# Patient Record
Sex: Female | Born: 2000 | Race: White | Hispanic: No | Marital: Single | Smoking: Never smoker
Health system: Southern US, Community
[De-identification: ages and names within clinical notes are randomized; demographics above are authoritative.]

## PROBLEM LIST (undated history)

## (undated) DIAGNOSIS — N946 Dysmenorrhea, unspecified: Secondary | ICD-10-CM

## (undated) DIAGNOSIS — N921 Excessive and frequent menstruation with irregular cycle: Secondary | ICD-10-CM

## (undated) HISTORY — DX: Excessive and frequent menstruation with irregular cycle: N92.1

## (undated) HISTORY — DX: Dysmenorrhea, unspecified: N94.6

---
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
## 2019-01-02 ENCOUNTER — Encounter: Payer: Self-pay | Admitting: Family Medicine

## 2019-01-02 ENCOUNTER — Telehealth: Payer: Self-pay | Admitting: *Deleted

## 2019-01-02 ENCOUNTER — Ambulatory Visit (INDEPENDENT_AMBULATORY_CARE_PROVIDER_SITE_OTHER): Payer: Medicaid Other | Admitting: Family Medicine

## 2019-01-02 ENCOUNTER — Encounter (INDEPENDENT_AMBULATORY_CARE_PROVIDER_SITE_OTHER): Payer: Self-pay

## 2019-01-02 ENCOUNTER — Other Ambulatory Visit: Payer: Self-pay

## 2019-01-02 ENCOUNTER — Other Ambulatory Visit: Payer: Medicaid Other

## 2019-01-02 DIAGNOSIS — R6883 Chills (without fever): Secondary | ICD-10-CM | POA: Diagnosis not present

## 2019-01-02 DIAGNOSIS — Z20822 Contact with and (suspected) exposure to covid-19: Secondary | ICD-10-CM

## 2019-01-02 DIAGNOSIS — R6889 Other general symptoms and signs: Secondary | ICD-10-CM | POA: Diagnosis not present

## 2019-01-02 DIAGNOSIS — R197 Diarrhea, unspecified: Secondary | ICD-10-CM

## 2019-01-02 DIAGNOSIS — R51 Headache: Secondary | ICD-10-CM

## 2019-01-02 DIAGNOSIS — R519 Headache, unspecified: Secondary | ICD-10-CM
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
## 2019-01-02 NOTE — Telephone Encounter (Signed)
-----   Message from Baruch Gouty, FNP sent at 01/02/2019 12:16 PM EDT ----- Diarrhea, headache, chills. Works with public.  Pamela Richardson MR [id number]

## 2019-01-02 NOTE — Progress Notes (Signed)
Virtual Visit via telephone Note Due to COVID-19, visit is conducted virtually and was requested by patient. This visit type was conducted due to national recommendations for restrictions regarding the COVID-19 Pandemic (e.g. social distancing) in an effort to limit this patient's exposure and mitigate transmission in our community. All issues noted in this document were discussed and addressed.  A physical exam was not performed with this format.   I connected with Pamela Richardson on 01/02/19 at 1200 by telephone and verified that I am speaking with the correct person using two identifiers. Pamela Richardson is currently located at home and family is currently with them during visit. The provider, Monia Pouch, FNP is located in their office at time of visit.  I discussed the limitations, risks, security and privacy concerns of performing an evaluation and management service by telephone and the availability of in person appointments. I also discussed with the patient that there may be a patient responsible charge related to this service. The patient expressed understanding and agreed to proceed.  Subjective:  Patient ID: Pamela Richardson, female    DOB: 01-Apr-2001, 18 y.o.   MRN: [id number]  Chief Complaint:  Diarrhea   HPI: Pamela Richardson is a 18 y.o. female presenting on 01/02/2019 for Diarrhea   Pt reports three days of diarrhea. States today she developed chills, headache, and malaise. 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 No cough, congestion, or shortness of breath.   Diarrhea  This is a new problem. The current episode started in the past 7 days. The problem occurs 2 to 4 times per day. The problem has been waxing and waning. The stool consistency is described as watery. Associated symptoms include abdominal pain, chills, headaches and myalgias. Pertinent negatives include no arthralgias, bloating, coughing, fever, increased   flatus, sweats, URI, vomiting or weight loss. She has tried nothing for the symptoms.     Relevant past medical, surgical, family, and social history reviewed and updated as indicated.  Allergies and medications reviewed and updated.   Past Medical History:  Diagnosis Date  . Dysmenorrhea 11/10/2015  . Menorrhagia with irregular cycle 11/10/2015    History reviewed. No pertinent surgical history.  Social History   Socioeconomic History  . Marital status: Single    Spouse name: Not on file  . Number of children: Not on file  . Years of education: Not on file  . Highest education level: Not on file  Occupational History  . Not on file  Social Needs  . Financial resource strain: Not on file  . Food insecurity    Worry: Not on file    Inability: Not on file  . Transportation needs    Medical: Not on file    Non-medical: Not on file  Tobacco Use  . Smoking status: Never Smoker  . Smokeless tobacco: Never Used  Substance and Sexual Activity  . Alcohol use: No  . Drug use: No  . Sexual activity: Yes    Birth control/protection: Pill  Lifestyle  . Physical activity    Days per week: Not on file    Minutes per session: Not on file  . Stress: Not on file  Relationships  . Social Herbalist on phone: Not on file    Gets together: Not on file    Attends religious service: Not on file    Active member of club or organization: Not on file  Attends meetings of clubs or organizations: Not on file    Relationship status: Not on file  . Intimate partner violence    Fear of current or ex partner: Not on file    Emotionally abused: Not on file    Physically abused: Not on file    Forced sexual activity: Not on file  Other Topics Concern  . Not on file  Social History Narrative  . Not on file    Outpatient Encounter Medications as of 01/02/2019  Medication Sig  . Norethindrone Acetate-Ethinyl Estradiol (LOESTRIN) 1.5-30 MG-MCG tablet Take 1 tablet by mouth  daily.   No facility-administered encounter medications on file as of 01/02/2019.     No Known Allergies  Review of Systems  Constitutional: Positive for appetite change, chills and fatigue. Negative for activity change, diaphoresis, fever, unexpected weight change and weight loss.  HENT: Negative for congestion.   Respiratory: Negative for cough and shortness of breath.   Cardiovascular: Negative for chest pain and palpitations.  Gastrointestinal: Positive for abdominal pain and diarrhea. Negative for abdominal distention, anal bleeding, bloating, blood in stool, constipation, flatus, nausea, rectal pain and vomiting.  Genitourinary: Negative for decreased urine volume and difficulty urinating.  Musculoskeletal: Positive for myalgias. Negative for arthralgias.  Skin: Negative for color change.  Neurological: Positive for headaches. Negative for dizziness, weakness and light-headedness.  Psychiatric/Behavioral: Negative for confusion.  All other systems reviewed and are negative.        Observations/Objective: No vital signs or physical exam, this was a telephone or virtual health encounter.  Pt alert and oriented, answers all questions appropriately, and able to speak in full sentences.    Assessment and Plan: Pamela Richardson was seen today for diarrhea.  Diagnoses and all orders for this visit:  Diarrhea in adult patient Aching headache Chills Suspected 2019 Novel Coronavirus Infection Reported symptoms concerning for COVID-19 infection. [facility], they will contact pt with testing instructions. Symptomatic care discussed. BRAT diet. Adequate hydration. Tylenol as needed for fever, headache, and myalgias. Report any new or worsening symptoms. Self quarantine guidelines discussed. Infection prevention measures discussed.     Follow Up Instructions: Return if symptoms worsen or fail to improve.    I discussed the assessment and treatment plan with the  patient. The patient was provided an opportunity to ask questions and all were answered. The patient agreed with the plan and demonstrated an understanding of the instructions.   The patient was advised to call back or seek an in-person evaluation if the symptoms worsen or if the condition fails to improve as anticipated.  The above assessment and management plan was discussed with the patient. The patient verbalized understanding of and has agreed to the management plan. Patient is aware to call the clinic if symptoms persist or worsen. Patient is aware when to return to the clinic for a follow-up visit. Patient educated on when it is appropriate to go to the emergency department.    I provided 15 minutes of non-face-to-face time during this encounter. The call started at 1200. The call ended at 1215. The other time was used for coordination of care.    Kari BaarsMichelle Rakes, FNP-C [facility] [address] [place], [place] [address] [phone]

## 2019-01-03 ENCOUNTER — Encounter (INDEPENDENT_AMBULATORY_CARE_PROVIDER_SITE_OTHER): Payer: Self-pay

## 2019-01-04 ENCOUNTER — Telehealth: Payer: Self-pay | Admitting: Nurse Practitioner

## 2019-01-04 ENCOUNTER — Telehealth: Payer: Self-pay | Admitting: *Deleted

## 2019-01-04 ENCOUNTER — Encounter (INDEPENDENT_AMBULATORY_CARE_PROVIDER_SITE_OTHER): Payer: Self-pay

## 2019-01-04 NOTE — Telephone Encounter (Signed)
Pt notified no results available If worsening SOB, pt needs to go to ER Pt verbalizes understanding

## 2019-01-04 NOTE — Telephone Encounter (Signed)
Needs to go to urgent care for chest xray

## 2019-01-04 NOTE — Telephone Encounter (Signed)
If she wold like to be tested for covid let us know

## 2019-01-04 NOTE — Telephone Encounter (Signed)
Contacted pt due to Ursa response of shortness of breath, the pt says that it is intermittent, and occurs with and without activity; she also says that she does not feel like she is in danger; pt advised to contact her PCP for further recommendations; she verbalized understanding.

## 2019-01-04 NOTE — Telephone Encounter (Signed)
Patient was tested for COVID 7/14 and states she started having SOB today.  Was advised to contact us since she had new symptoms. Please advise

## 2019-01-04 NOTE — Telephone Encounter (Signed)
Patient aware.

## 2019-01-05 ENCOUNTER — Encounter (INDEPENDENT_AMBULATORY_CARE_PROVIDER_SITE_OTHER): Payer: Self-pay

## 2019-01-05 ENCOUNTER — Telehealth: Payer: Self-pay

## 2019-01-05 NOTE — Telephone Encounter (Signed)
Patient called due to Dobson response of vomiting and worse appetite than yesterday. She says this morning about 0500, she woke up drenched in sweat, went to the bathroom to vomit, but only dry heaves. She says she's nauseated. I advised to stay hydrated with liquids, ice chips, popsicles, anything that she can sip on and to notify her PCP about the nausea, she verbalized understanding.

## 2019-01-06 ENCOUNTER — Other Ambulatory Visit: Payer: Self-pay

## 2019-01-06 ENCOUNTER — Telehealth: Payer: Self-pay

## 2019-01-06 ENCOUNTER — Emergency Department (HOSPITAL_COMMUNITY)
Admission: EM | Admit: 2019-01-06 | Discharge: 2019-01-06 | Disposition: A | Discharge status: Home / Self Care | Payer: Medicaid Other | Attending: Emergency Medicine | Admitting: Emergency Medicine

## 2019-01-06 ENCOUNTER — Encounter (INDEPENDENT_AMBULATORY_CARE_PROVIDER_SITE_OTHER): Payer: Self-pay

## 2019-01-06 ENCOUNTER — Encounter (HOSPITAL_COMMUNITY): Payer: Self-pay

## 2019-01-06 DIAGNOSIS — Z20828 Contact with and (suspected) exposure to other viral communicable diseases: Secondary | ICD-10-CM | POA: Insufficient documentation

## 2019-01-06 DIAGNOSIS — Z03818 Encounter for observation for suspected exposure to other biological agents ruled out: Secondary | ICD-10-CM | POA: Diagnosis not present

## 2019-01-06 DIAGNOSIS — Z79899 Other long term (current) drug therapy: Secondary | ICD-10-CM | POA: Diagnosis not present

## 2019-01-06 DIAGNOSIS — R112 Nausea with vomiting, unspecified: Secondary | ICD-10-CM | POA: Diagnosis not present

## 2019-01-06 DIAGNOSIS — R109 Unspecified abdominal pain: Secondary | ICD-10-CM | POA: Insufficient documentation

## 2019-01-06 DIAGNOSIS — R1013 Epigastric pain: Secondary | ICD-10-CM | POA: Diagnosis not present

## 2019-01-06 DIAGNOSIS — R197 Diarrhea, unspecified: Secondary | ICD-10-CM | POA: Diagnosis not present

## 2019-01-06 LAB — COMPREHENSIVE METABOLIC PANEL
ALT: 13 U/L (ref 0–44)
AST: 18 U/L (ref 15–41)
Albumin: 4 g/dL (ref 3.5–5.0)
Alkaline Phosphatase: 73 U/L (ref 38–126)
Anion gap: 11 (ref 5–15)
BUN: 8 mg/dL (ref 6–20)
CO2: 21 mmol/L — ABNORMAL LOW (ref 22–32)
Calcium: 9.4 mg/dL (ref 8.9–10.3)
Chloride: 104 mmol/L (ref 98–111)
Creatinine, Ser: 0.72 mg/dL (ref 0.44–1.00)
GFR calc Af Amer: >60 mL/min (ref >60)
GFR calc non Af Amer: >60 mL/min (ref >60)
Glucose, Bld: 83 mg/dL (ref 70–99)
Potassium: 3.9 mmol/L (ref 3.5–5.1)
Sodium: 136 mmol/L (ref 135–145)
Total Bilirubin: 0.1 mg/dL — ABNORMAL LOW (ref 0.3–1.2)
Total Protein: 7.5 g/dL (ref 6.5–8.1)

## 2019-01-06 LAB — CBC WITH DIFFERENTIAL/PLATELET
Abs Immature Granulocytes: 0.02 10*3/uL (ref 0.00–0.07)
Basophils Absolute: 0.1 10*3/uL (ref 0.0–0.1)
Basophils Relative: 1 %
Eosinophils Absolute: 0.1 10*3/uL (ref 0.0–0.5)
Eosinophils Relative: 1 %
HCT: 40 % (ref 36.0–46.0)
Hemoglobin: 13.3 g/dL (ref 12.0–15.0)
Immature Granulocytes: 0 %
Lymphocytes Relative: 25 %
Lymphs Abs: 2.2 10*3/uL (ref 0.7–4.0)
MCH: 28.7 pg (ref 26.0–34.0)
MCHC: 33.3 g/dL (ref 30.0–36.0)
MCV: 86.4 fL (ref 80.0–100.0)
Monocytes Absolute: 0.5 10*3/uL (ref 0.1–1.0)
Monocytes Relative: 6 %
Neutro Abs: 5.7 10*3/uL (ref 1.7–7.7)
Neutrophils Relative %: 67 %
Platelets: 293 10*3/uL (ref 150–400)
RBC: 4.63 MIL/uL (ref 3.87–5.11)
RDW: 12.7 % (ref 11.5–15.5)
WBC: 8.5 10*3/uL (ref 4.0–10.5)
nRBC: 0 % (ref 0.0–0.2)

## 2019-01-06 LAB — PREGNANCY, URINE: Preg Test, Ur: NEGATIVE

## 2019-01-06 LAB — URINALYSIS, ROUTINE W REFLEX MICROSCOPIC
Bilirubin Urine: NEGATIVE
Glucose, UA: NEGATIVE mg/dL
Hgb urine dipstick: NEGATIVE
Ketones, ur: 5 mg/dL — AB
Leukocytes,Ua: NEGATIVE
Nitrite: NEGATIVE
Protein, ur: NEGATIVE mg/dL
Specific Gravity, Urine: 1.011 (ref 1.005–1.030)
pH: 6 (ref 5.0–8.0)

## 2019-01-06 LAB — LIPASE, BLOOD: Lipase: 26 U/L (ref 11–51)

## 2019-01-06 MED ORDER — ALUM & MAG HYDROXIDE-SIMETH 200-200-20 MG/5ML PO SUSP
30.0000 mL | Freq: Once | ORAL | Status: AC
  Administered 2019-01-06: 18:00:00 30 mL via ORAL
  Filled 2019-01-06: qty 30

## 2019-01-06 MED ORDER — LIDOCAINE VISCOUS HCL 2 % MT SOLN
15.0000 mL | Freq: Once | OROMUCOSAL | Status: AC
  Administered 2019-01-06: 15 mL via ORAL
  Filled 2019-01-06: qty 15

## 2019-01-06 MED ORDER — ONDANSETRON 4 MG PO TBDP: 4.0000 mg | ORAL_TABLET | Freq: Three times a day (TID) | ORAL | 0 refills | Status: DC | PRN

## 2019-01-06 MED ORDER — ONDANSETRON HCL 4 MG/2ML IJ SOLN
4.0000 mg | Freq: Once | INTRAMUSCULAR | Status: AC
  Administered 2019-01-06: 19:00:00 4 mg via INTRAVENOUS
  Filled 2019-01-06: qty 2

## 2019-01-06 MED ORDER — SODIUM CHLORIDE 0.9 % IV BOLUS
1000.0000 mL | Freq: Once | INTRAVENOUS | Status: AC
  Administered 2019-01-06: 1000 mL via INTRAVENOUS

## 2019-01-06 NOTE — ED Notes (Signed)
ED Provider at bedside. 

## 2019-01-06 NOTE — ED Notes (Signed)
Patient verbalizes understanding of discharge instructions. Opportunity for questioning and answers were provided. Armband removed by staff, pt discharged from ED.  

## 2019-01-06 NOTE — Discharge Instructions (Addendum)
You have been seen today for abdominal pain. Please read and follow all provided instructions. Return to the emergency room for worsening condition or new concerning symptoms.    Your COVID test is pending.  You should continue to self quarantine until you get the results.  1. Medications:  Prescription sent to your pharmacy for Zofran.  This is for nausea.  Please take as directed. Continue usual home medications Take medications as prescribed. Please review all of the medicines and only take them if you do not have an allergy to them.   2. Treatment: rest, drink plenty of fluids  3. Follow Up: Please follow up with your primary doctor in 2-5 days for discussion of your diagnoses and further evaluation after today's visit; Call today to arrange your follow up.    It is also a possibility that you have an allergic reaction to any of the medicines that you have been prescribed - Everybody reacts differently to medications and while MOST people have no trouble with most medicines, you may have a reaction such as nausea, vomiting, rash, swelling, shortness of breath. If this is the case, please stop taking the medicine immediately and contact your physician.  ?

## 2019-01-06 NOTE — ED Provider Notes (Signed)
Care assumed from previous provider PA [place]. Please see note for further details. Case discussed, plan agreed upon. Will follow up on UA, Upreg as well as PO challenge.   UA unremarkable Upreg negative  Patient tolerating PO.  Discharged home with previous provider recommendations and rx.    Pamela Richardson, Ozella Almond, PA-C 01/06/19 2031    Elnora Morrison, MD 01/07/19 [phone]

## 2019-01-06 NOTE — ED Provider Notes (Addendum)
[facility] Provider Note   CSN: [id number] Arrival date & time: 01/06/19  1618    History   Chief Complaint Chief Complaint  Patient presents with  . COVID sxs    HPI Pamela Richardson is a 18 y.o. female with past medical history of dysmenorrhea sent to emergency department today with abdominal cramping x 1 week. She states she has constant cramping sensation that feels different than her usual cramps with menses cycle. She states the cramping is painful rating it 6 out of 10 in severity.  She reports associated nause and vomiting.  She reports 2 episodes of nonbloody nonbilious emesis in the last 24 hours.  She has had decreased p.o. intake since symptom onset.  She is also reporting generalized body aches.  She tested for COVID earlier this week but has not yet gotten the results.  She has not taken any medications for her symptoms prior to arrival.  She denies fever, chills, chest pain, shortness of breath, cough, urinary symptoms, diarrhea.  She denies any sick contacts.   Past Medical History:  Diagnosis Date  . Dysmenorrhea 11/10/2015  . Menorrhagia with irregular cycle 11/10/2015    Patient Active Problem List   Diagnosis Date Noted  . Chronic idiopathic constipation 04/21/2016  . Dysmenorrhea 11/10/2015    History reviewed. No pertinent surgical history.   OB History    Gravida  0   Para  0   Term  0   Preterm  0   AB  0   Living  0     SAB  0   TAB  0   Ectopic  0   Multiple  0   Live Births               Home Medications    Prior to Admission medications   Medication Sig Start Date End Date Taking? Authorizing Provider  Norethindrone Acetate-Ethinyl Estradiol (LOESTRIN) 1.5-30 MG-MCG tablet Take 1 tablet by mouth daily. 11/01/18   Adline PotterGriffin, Jennifer A, NP  ondansetron (ZOFRAN ODT) 4 MG disintegrating tablet Take 1 tablet (4 mg total) by mouth every 8 (eight) hours as needed for nausea or vomiting. 01/06/19    Mayley Lish, Caroleen HammanKaitlyn E, PA-C    Family History Family History  Problem Relation Age of Onset  . Scoliosis Mother   . Other Maternal Grandmother        suicide  . Hypertension Maternal Grandfather   . Diabetes Maternal Grandfather   . Cancer Maternal Grandfather   . Scoliosis Other     Social History Social History   Tobacco Use  . Smoking status: Never Smoker  . Smokeless tobacco: Never Used  Substance Use Topics  . Alcohol use: No  . Drug use: No     Allergies   Patient has no known allergies.   Review of Systems Review of Systems  Constitutional: Positive for appetite change. Negative for chills and fever.  HENT: Negative for congestion, ear discharge, ear pain, sinus pressure, sinus pain and sore throat.   Eyes: Negative for pain and redness.  Respiratory: Negative for cough and shortness of breath.   Cardiovascular: Negative for chest pain.  Gastrointestinal: Positive for abdominal pain, nausea and vomiting. Negative for constipation and diarrhea.  Genitourinary: Negative for dysuria and hematuria.  Musculoskeletal: Negative for back pain and neck pain.  Skin: Negative for wound.  Neurological: Negative for weakness, numbness and headaches.     Physical Exam Updated Vital Signs BP 137/87 (BP  Location: Right Arm)   Pulse (!) 103   Temp 99.2 F (37.3 C) (Oral)   Resp 15   Ht 5\' 5"  (1.651 m)   Wt 59 kg   SpO2 98%   BMI 21.63 kg/m   Physical Exam Vitals signs and nursing note reviewed.  Constitutional:      General: She is not in acute distress.    Appearance: She is not ill-appearing.  HENT:     Head: Normocephalic and atraumatic.     Right Ear: Tympanic membrane and external ear normal.     Left Ear: Tympanic membrane and external ear normal.     Nose: Nose normal. No congestion.     Mouth/Throat:     Mouth: Mucous membranes are dry.     Pharynx: Oropharynx is clear.  Eyes:     General: No scleral icterus.       Right eye: No discharge.         Left eye: No discharge.     Extraocular Movements: Extraocular movements intact.     Conjunctiva/sclera: Conjunctivae normal.     Pupils: Pupils are equal, round, and reactive to light.  Neck:     Musculoskeletal: Normal range of motion.     Vascular: No JVD.  Cardiovascular:     Rate and Rhythm: Regular rhythm. Tachycardia present.     Pulses: Normal pulses.          Radial pulses are 2+ on the right side and 2+ on the left side.     Heart sounds: Normal heart sounds.  Pulmonary:     Comments: Lungs clear to auscultation in all fields. Symmetric chest rise. No wheezing, rales, or rhonchi. Abdominal:     General: Bowel sounds are normal.     Tenderness: There is generalized abdominal tenderness. There is no right CVA tenderness or left CVA tenderness.     Comments: Abdomen is soft, non-distended. No rigidity, no guarding. No peritoneal signs.  Musculoskeletal: Normal range of motion.  Skin:    General: Skin is warm and dry.     Capillary Refill: Capillary refill takes less than 2 seconds.  Neurological:     Mental Status: She is oriented to person, place, and time.     GCS: GCS eye subscore is 4. GCS verbal subscore is 5. GCS motor subscore is 6.     Comments: Fluent speech, no facial droop.  Psychiatric:        Behavior: Behavior normal.      ED Treatments / Results  Labs (all labs ordered are listed, but only abnormal results are displayed) Labs Reviewed  COMPREHENSIVE METABOLIC PANEL - Abnormal; Notable for the following components:      Result Value   CO2 21 (*)    Total Bilirubin 0.1 (*)    All other components within normal limits  NOVEL CORONAVIRUS, NAA (HOSPITAL ORDER, SEND-OUT TO REF LAB)  CBC WITH DIFFERENTIAL/PLATELET  LIPASE, BLOOD  URINALYSIS, ROUTINE W REFLEX MICROSCOPIC  PREGNANCY, URINE    EKG None  Radiology No results found.  Procedures Procedures (including critical care time)  Medications Ordered in ED Medications  sodium chloride 0.9 %  bolus 1,000 mL (1,000 mLs Intravenous New Bag/Given 01/06/19 1838)  ondansetron (ZOFRAN) injection 4 mg (4 mg Intravenous Given 01/06/19 1837)  alum & mag hydroxide-simeth (MAALOX/MYLANTA) 200-200-20 MG/5ML suspension 30 mL (30 mLs Oral Given 01/06/19 1754)    And  lidocaine (XYLOCAINE) 2 % viscous mouth solution 15 mL (15 mLs Oral Given  01/06/19 1754)     Initial Impression / Assessment and Plan / ED Course  I have reviewed the triage vital signs and the nursing notes.  Pertinent labs & imaging results that were available during my care of the patient were reviewed by me and considered in my medical decision making (see chart for details).   Patient is well-appearing, no acute distress.  In triage she was noted to be tachycardic at 103.  During interview and physical exam heart rate ranged from 85-94. Lungs are clear to auscultation all fields. She denies cought, do not feel chest xray is necessary at this time. She has generalized abdominal tenderness on exam, no peritoneal signs. Mucous membranes are dry.  Labs unremarkable. No leukocytosis, electrolyte abnormalities or renal insufficiency.  After GI cocktail she states abdominal pain has improved.  On repeat exam abdomen is benign.  No peritoneal signs. Tested for covid with send out test. Pt reports she had a test but it did not hurt and only went just inside her nose, unsure if will be accurate.  UA and pregnancy test pending.   Pamela Richardson was evaluated in Emergency Department on 01/06/2019 for the symptoms described in the history of present illness. She was evaluated in the context of the global COVID-19 pandemic, which necessitated consideration that the patient might be at risk for infection with the SARS-CoV-2 virus that causes COVID-19. Institutional protocols and algorithms that pertain to the evaluation of patients at risk for COVID-19 are in a state of rapid change based on information released by regulatory bodies including the CDC and  federal and state organizations. These policies and algorithms were followed during the patient's care in the ED.   Patient care transferred to J. Ward PA-C at the end of my shift. Patient presentation, ED course, and plan of care discussed with review of all pertinent labs and imaging. Please see her note for further details regarding further ED course and disposition.   Final Clinical Impressions(s) / ED Diagnoses   Final diagnoses:  Abdominal pain, unspecified abdominal location    ED Discharge Orders         Ordered    ondansetron (ZOFRAN ODT) 4 MG disintegrating tablet  Every 8 hours PRN     01/06/19 1912           Cherre Robins, PA-C 01/06/19 1917    Flint Melter 01/06/19 Oris Drone, MD 01/07/19 2022

## 2019-01-06 NOTE — Telephone Encounter (Signed)
Pt decrease appetite becoming worse. Pt advised recommendation based on MyChart protocols. Pt verbalized understanding.

## 2019-01-06 NOTE — ED Triage Notes (Addendum)
Pt arrives POV from home c/o body aches, abdominal pain, nausea, unable to keep food down since last Sunday Pt was advised by [facility] RN to come to the ED to be tested for Covid-19. Pt was swabbed for COVID-19 on Tuesday afternoon at [facility] in [place] but was told she will have the result next Tuesday.

## 2019-01-07 ENCOUNTER — Encounter (INDEPENDENT_AMBULATORY_CARE_PROVIDER_SITE_OTHER): Payer: Self-pay

## 2019-01-07 LAB — NOVEL CORONAVIRUS, NAA: SARS-CoV-2, NAA: NOT DETECTED

## 2019-01-08 ENCOUNTER — Encounter (INDEPENDENT_AMBULATORY_CARE_PROVIDER_SITE_OTHER): Payer: Self-pay

## 2019-01-08 ENCOUNTER — Telehealth: Payer: Self-pay

## 2019-01-08 ENCOUNTER — Telehealth: Payer: Self-pay | Admitting: Nurse Practitioner

## 2019-01-08 NOTE — Telephone Encounter (Signed)
appt scheduled Pt notified 

## 2019-01-08 NOTE — Telephone Encounter (Signed)
Abdominal pain seen in ED--as soon as pt eats she developed abdominal pain.  Problem has lasted for over a week. Pt denies distension. No BM since Tuesday.  Pt advised to call PCP for hospital F/U.  Discussed appetite and informed:   . If appetite becomes worse:   encourage patient to drink fluids as tolerated, work their way up to bland solid food such as crackers, pretzels, soup, bread or applesauce and boiled starches.  . If patient is unable to tolerate any foods or liquids, notify PCP. Marland Kitchen  If patient develops severe vomiting (more than 6 times a day and or >8 hours) and/or severe abdominal pain advise patient to call 911 and seek treatment in ED

## 2019-01-09 ENCOUNTER — Encounter: Payer: Self-pay | Admitting: Nurse Practitioner

## 2019-01-09 ENCOUNTER — Ambulatory Visit (INDEPENDENT_AMBULATORY_CARE_PROVIDER_SITE_OTHER): Payer: Medicaid Other | Admitting: Nurse Practitioner

## 2019-01-09 DIAGNOSIS — K269 Duodenal ulcer, unspecified as acute or chronic, without hemorrhage or perforation: Secondary | ICD-10-CM

## 2019-01-09 LAB — NOVEL CORONAVIRUS, NAA (HOSP ORDER, SEND-OUT TO REF LAB; TAT 18-24 HRS): SARS-CoV-2, NAA: NOT DETECTED

## 2019-01-09 MED ORDER — OMEPRAZOLE 40 MG PO CPDR: 40.0000 mg | DELAYED_RELEASE_CAPSULE | Freq: Every day | ORAL | 3 refills | Status: DC

## 2019-01-09 NOTE — Progress Notes (Signed)
   Virtual Visit via telephone Note  I connected with Pamela Richardson on 01/09/19 at 11:00 by telephone and verified that I am speaking with the correct person using two identifiers. Pamela Richardson is currently located at home and no one is currently with her during visit. The provider, Mary-Margaret Hassell Done, FNP is located in their office at time of visit.  I discussed the limitations, risks, security and privacy concerns of performing an evaluation and management service by telephone and the availability of in person appointments. I also discussed with the patient that there may be a patient responsible charge related to this service. The patient expressed understanding and agreed to proceed.   History and Present Illness:   Chief Complaint: Hospitalization Follow-up   HPI Patient has been sick for several weeks. She started having diarrhea last weekend . She called office and they wanted her corona tested- they came back negative. She continued to have diarrhea so she went to the ED on 01/06/19. They did blood work which was all negative. They sent her home on zofran. She has not taken zofran because she has had no nausea. She has not had a bowel movement since last Tuesday. She says she is having abdominal cramping. Cramping usually occurs after she eatss and is in her upper abdomen. Pain last 1-2 hours at a time   Review of Systems  Constitutional: Negative for diaphoresis and weight loss.  Eyes: Negative for blurred vision, double vision and pain.  Respiratory: Negative for shortness of breath.   Cardiovascular: Negative for chest pain, palpitations, orthopnea and leg swelling.  Gastrointestinal: Negative for abdominal pain.  Skin: Negative for rash.  Neurological: Negative for dizziness, sensory change, loss of consciousness, weakness and headaches.  Endo/Heme/Allergies: Negative for polydipsia. Does not bruise/bleed easily.  Psychiatric/Behavioral: Negative for memory loss. The patient  does not have insomnia.   All other systems reviewed and are negative.    Observations/Objective: Alert and oriented- answers all questions appropriately Mid distress.  Assessment and Plan: Pamela Richardson in today with chief complaint of Hospitalization Follow-up   1. Duodenal ulcer Could be gallbladder If meprazole does not help will order gall bladder u/s Avoid spicy and fatty foods - omeprazole (PRILOSEC) 40 MG capsule; Take 1 capsule (40 mg total) by mouth daily.  Dispense: 30 capsule; Refill: 3   Follow Up Instructions: prn    I discussed the assessment and treatment plan with the patient. The patient was provided an opportunity to ask questions and all were answered. The patient agreed with the plan and demonstrated an understanding of the instructions.   The patient was advised to call back or seek an in-person evaluation if the symptoms worsen or if the condition fails to improve as anticipated.  The above assessment and management plan was discussed with the patient. The patient verbalized understanding of and has agreed to the management plan. Patient is aware to call the clinic if symptoms persist or worsen. Patient is aware when to return to the clinic for a follow-up visit. Patient educated on when it is appropriate to go to the emergency department.   Time call ended:  11:15  I provided 15 minutes of non-face-to-face time during this encounter.    Mary-Margaret Hassell Done, FNP

## 2019-02-01 ENCOUNTER — Ambulatory Visit: Payer: Medicaid Other | Admitting: Adult Health

## 2019-02-08 ENCOUNTER — Other Ambulatory Visit: Payer: Self-pay

## 2019-02-08 ENCOUNTER — Ambulatory Visit (INDEPENDENT_AMBULATORY_CARE_PROVIDER_SITE_OTHER): Payer: Medicaid Other | Admitting: Women's Health

## 2019-02-08 ENCOUNTER — Encounter: Payer: Self-pay | Admitting: Women's Health

## 2019-02-08 ENCOUNTER — Ambulatory Visit (INDEPENDENT_AMBULATORY_CARE_PROVIDER_SITE_OTHER): Payer: Medicaid Other | Admitting: *Deleted

## 2019-02-08 VITALS — BP 121/82 | HR 92 | Ht 65.0 in | Wt 120.0 lb

## 2019-02-08 DIAGNOSIS — Z3202 Encounter for pregnancy test, result negative: Secondary | ICD-10-CM

## 2019-02-08 DIAGNOSIS — N920 Excessive and frequent menstruation with regular cycle: Secondary | ICD-10-CM

## 2019-02-08 DIAGNOSIS — Z30013 Encounter for initial prescription of injectable contraceptive: Secondary | ICD-10-CM

## 2019-02-08 DIAGNOSIS — Z3042 Encounter for surveillance of injectable contraceptive: Secondary | ICD-10-CM

## 2019-02-08 DIAGNOSIS — N946 Dysmenorrhea, unspecified: Secondary | ICD-10-CM

## 2019-02-08 LAB — POCT URINE PREGNANCY: Preg Test, Ur: NEGATIVE

## 2019-02-08 MED ORDER — MEDROXYPROGESTERONE ACETATE 150 MG/ML IM SUSP
150.0000 mg | Freq: Once | INTRAMUSCULAR | Status: AC
  Administered 2019-02-08: 150 mg via INTRAMUSCULAR

## 2019-02-08 MED ORDER — MEDROXYPROGESTERONE ACETATE 150 MG/ML IM SUSP: 150.0000 mg | INTRAMUSCULAR | 3 refills | Status: DC

## 2019-02-08 NOTE — Patient Instructions (Signed)
Medroxyprogesterone injection [Contraceptive] What is this medicine? MEDROXYPROGESTERONE (me DROX ee proe JES te rone) contraceptive injections prevent pregnancy. They provide effective birth control for 3 months. Depo-subQ Provera 104 is also used for treating pain related to endometriosis. This medicine may be used for other purposes; ask your health care provider or pharmacist if you have questions. COMMON BRAND NAME(S): Depo-Provera, Depo-subQ Provera 104 What should I tell my health care provider before I take this medicine? They need to know if you have any of these conditions:  frequently drink alcohol  asthma  blood vessel disease or a history of a blood clot in the lungs or legs  bone disease such as osteoporosis  breast cancer  diabetes  eating disorder (anorexia nervosa or bulimia)  high blood pressure  HIV infection or AIDS  kidney disease  liver disease  mental depression  migraine  seizures (convulsions)  stroke  tobacco smoker  vaginal bleeding  an unusual or allergic reaction to medroxyprogesterone, other hormones, medicines, foods, dyes, or preservatives  pregnant or trying to get pregnant  breast-feeding How should I use this medicine? Depo-Provera Contraceptive injection is given into a muscle. Depo-subQ Provera 104 injection is given under the skin. These injections are given by a health care professional. You must not be pregnant before getting an injection. The injection is usually given during the first 5 days after the start of a menstrual period or 6 weeks after delivery of a baby. Talk to your pediatrician regarding the use of this medicine in children. Special care may be needed. These injections have been used in female children who have started having menstrual periods. Overdosage: If you think you have taken too much of this medicine contact a poison control center or emergency room at once. NOTE: This medicine is only for you. Do not  share this medicine with others. What if I miss a dose? Try not to miss a dose. You must get an injection once every 3 months to maintain birth control. If you cannot keep an appointment, call and reschedule it. If you wait longer than 13 weeks between Depo-Provera contraceptive injections or longer than 14 weeks between Depo-subQ Provera 104 injections, you could get pregnant. Use another method for birth control if you miss your appointment. You may also need a pregnancy test before receiving another injection. What may interact with this medicine? Do not take this medicine with any of the following medications:  bosentan This medicine may also interact with the following medications:  aminoglutethimide  antibiotics or medicines for infections, especially rifampin, rifabutin, rifapentine, and griseofulvin  aprepitant  barbiturate medicines such as phenobarbital or primidone  bexarotene  carbamazepine  medicines for seizures like ethotoin, felbamate, oxcarbazepine, phenytoin, topiramate  modafinil  St. John's wort This list may not describe all possible interactions. Give your health care provider a list of all the medicines, herbs, non-prescription drugs, or dietary supplements you use. Also tell them if you smoke, drink alcohol, or use illegal drugs. Some items may interact with your medicine. What should I watch for while using this medicine? This drug does not protect you against HIV infection (AIDS) or other sexually transmitted diseases. Use of this product may cause you to lose calcium from your bones. Loss of calcium may cause weak bones (osteoporosis). Only use this product for more than 2 years if other forms of birth control are not right for you. The longer you use this product for birth control the more likely you will be at risk   for weak bones. Ask your health care professional how you can keep strong bones. You may have a change in bleeding pattern or irregular periods.  Many females stop having periods while taking this drug. If you have received your injections on time, your chance of being pregnant is very low. If you think you may be pregnant, see your health care professional as soon as possible. Tell your health care professional if you want to get pregnant within the next year. The effect of this medicine may last a long time after you get your last injection. What side effects may I notice from receiving this medicine? Side effects that you should report to your doctor or health care professional as soon as possible:  allergic reactions like skin rash, itching or hives, swelling of the face, lips, or tongue  breast tenderness or discharge  breathing problems  changes in vision  depression  feeling faint or lightheaded, falls  fever  pain in the abdomen, chest, groin, or leg  problems with balance, talking, walking  unusually weak or tired  yellowing of the eyes or skin Side effects that usually do not require medical attention (report to your doctor or health care professional if they continue or are bothersome):  acne  fluid retention and swelling  headache  irregular periods, spotting, or absent periods  temporary pain, itching, or skin reaction at site where injected  weight gain This list may not describe all possible side effects. Call your doctor for medical advice about side effects. You may report side effects to FDA at 1-800-FDA-1088. Where should I keep my medicine? This does not apply. The injection will be given to you by a health care professional. NOTE: This sheet is a summary. It may not cover all possible information. If you have questions about this medicine, talk to your doctor, pharmacist, or health care provider.  2020 Elsevier/Gold Standard (2008-06-28 18:37:56)  

## 2019-02-08 NOTE — Progress Notes (Signed)
   GYN VISIT Patient name: Pamela Richardson MRN [id number]  Date of birth: 10-Nov-2000 Chief Complaint:   Follow-up (Birth Control)  History of Present Illness:   Pamela Richardson is a 18 y.o. G0P0000 Caucasian female being seen today for f/u on Lo Estrin. Has tried multiple different pills to try to regulate and lighten periods, has gotten down to 1 period/mth, but still heavy. Stopped taking COCs 2wks ago, had period, no sex since. Wants to try something else. Discussed all options. Wants depo.      Patient's last menstrual period was 01/26/2019. The current method of family planning is OCP (estrogen/progesterone).  Last pap <21yo. Results were:  n/a Review of Systems:   Pertinent items are noted in HPI Denies fever/chills, dizziness, headaches, visual disturbances, fatigue, shortness of breath, chest pain, abdominal pain, vomiting, abnormal vaginal discharge/itching/odor/irritation, problems with periods, bowel movements, urination, or intercourse unless otherwise stated above.  Pertinent History Reviewed:  Reviewed past medical,surgical, social, obstetrical and family history.  Reviewed problem list, medications and allergies. Physical Assessment:   Vitals:   02/08/19 1347  BP: 121/82  Pulse: 92  Weight: 120 lb (54.4 kg)  Height: 5\' 5"  (1.651 m)  Body mass index is 19.97 kg/m.       Physical Examination:   General appearance: alert, well appearing, and in no distress  Mental status: alert, oriented to person, place, and time  Skin: warm & dry   Cardiovascular: normal heart rate noted  Respiratory: normal respiratory effort, no distress  Abdomen: soft, non-tender   Pelvic: examination not indicated  Extremities: no edema   No results found for this or any previous visit (from the past 24 hour(s)).  Assessment & Plan:  1) Contraception management> rx depo, come back today for 1st shot, condoms x 2wks  2) Heavy periods> will try depo  Meds:  Meds ordered this encounter   Medications  . medroxyPROGESTERone (DEPO-PROVERA) 150 MG/ML injection    Sig: Inject 1 mL (150 mg total) into the muscle every 3 (three) months.    Dispense:  1 mL    Refill:  3    Order Specific Question:   Supervising Provider    Answer:   Elonda Husky, LUTHER H [[id number]]    No orders of the defined types were placed in this encounter.   Return for today for , Depo injection.  [place], [facility] ([facility]) 02/08/2019 2:33 PM

## 2019-03-22 DIAGNOSIS — F439 Reaction to severe stress, unspecified: Secondary | ICD-10-CM | POA: Diagnosis not present

## 2019-03-22 DIAGNOSIS — F331 Major depressive disorder, recurrent, moderate: Secondary | ICD-10-CM | POA: Diagnosis not present

## 2019-04-13 DIAGNOSIS — F331 Major depressive disorder, recurrent, moderate: Secondary | ICD-10-CM | POA: Diagnosis not present

## 2019-05-03 ENCOUNTER — Other Ambulatory Visit: Payer: Self-pay

## 2019-05-03 ENCOUNTER — Ambulatory Visit (INDEPENDENT_AMBULATORY_CARE_PROVIDER_SITE_OTHER): Payer: Medicaid Other | Admitting: *Deleted

## 2019-05-03 DIAGNOSIS — Z3042 Encounter for surveillance of injectable contraceptive: Secondary | ICD-10-CM

## 2019-05-03 DIAGNOSIS — Z30013 Encounter for initial prescription of injectable contraceptive: Secondary | ICD-10-CM

## 2019-05-03 MED ORDER — MEDROXYPROGESTERONE ACETATE 150 MG/ML IM SUSP
150.0000 mg | Freq: Once | INTRAMUSCULAR | Status: AC
  Administered 2019-05-03: 150 mg via INTRAMUSCULAR

## 2019-05-07 DIAGNOSIS — F331 Major depressive disorder, recurrent, moderate: Secondary | ICD-10-CM | POA: Diagnosis not present

## 2019-05-15 DIAGNOSIS — F331 Major depressive disorder, recurrent, moderate: Secondary | ICD-10-CM | POA: Diagnosis not present

## 2019-06-13 DIAGNOSIS — F331 Major depressive disorder, recurrent, moderate: Secondary | ICD-10-CM | POA: Diagnosis not present

## 2019-07-03 ENCOUNTER — Telehealth: Payer: Self-pay | Admitting: Nurse Practitioner

## 2019-07-03 NOTE — Telephone Encounter (Signed)
Pt called wanting to schedule to see MMM about anxiety. Explained to patient that her 1st available in person appt wasn't until 07/18/19. Pt wants to be seen sooner than that. Can we open up an appt for her to be seen sooner?

## 2019-07-03 NOTE — Telephone Encounter (Signed)
appt made

## 2019-07-05 DIAGNOSIS — F331 Major depressive disorder, recurrent, moderate: Secondary | ICD-10-CM | POA: Diagnosis not present

## 2019-07-09 ENCOUNTER — Other Ambulatory Visit: Payer: Self-pay

## 2019-07-10 ENCOUNTER — Ambulatory Visit (INDEPENDENT_AMBULATORY_CARE_PROVIDER_SITE_OTHER): Payer: Medicaid Other | Admitting: Nurse Practitioner

## 2019-07-10 ENCOUNTER — Encounter: Payer: Self-pay | Admitting: Nurse Practitioner

## 2019-07-10 VITALS — BP 128/79 | HR 107 | Temp 98.9°F | Resp 20 | Ht 65.0 in | Wt 130.0 lb

## 2019-07-10 DIAGNOSIS — F418 Other specified anxiety disorders: Secondary | ICD-10-CM | POA: Diagnosis not present

## 2019-07-10 MED ORDER — ESCITALOPRAM OXALATE 20 MG PO TABS: 20.0000 mg | ORAL_TABLET | Freq: Every day | ORAL | 5 refills | Status: DC

## 2019-07-10 NOTE — Progress Notes (Signed)
   Subjective:    Patient ID: Pamela Richardson, female    DOB: Mar 22, 2001, 19 y.o.   MRN: [id number]   Chief Complaint: Depression   HPI Patient comes in today c/o de[ression. She has been counselor for 6 months. There therapist thinks that she needs to be on an antidepessant. She has had depression snce she was in high school. She has never been on any medications for depression or anxiety.  GAD 7 : Generalized Anxiety Score 07/10/2019  Nervous, Anxious, on Edge 3  Control/stop worrying 3  Worry too much - different things 3  Trouble relaxing 1  Restless 0  Easily annoyed or irritable 1  Afraid - awful might happen 3  Total GAD 7 Score 14  Anxiety Difficulty Somewhat difficult    Depression screen [facility] 2/9 07/10/2019 11/01/2018 05/12/2018  Decreased Interest 3 0 1  Down, Depressed, Hopeless 3 0 1  PHQ - 2 Score 6 0 2  Altered sleeping 3 - 2  Tired, decreased energy 3 - 1  Change in appetite 1 - 2  Feeling bad or failure about yourself  3 - 1  Trouble concentrating 3 - 1  Moving slowly or fidgety/restless 1 - 0  Suicidal thoughts 3 - 0  PHQ-9 Score 23 - 9  Difficult doing work/chores Not difficult at all - -     Review of Systems  Constitutional: Negative for diaphoresis.  Eyes: Negative for pain.  Respiratory: Negative for shortness of breath.   Cardiovascular: Negative for chest pain, palpitations and leg swelling.  Gastrointestinal: Negative for abdominal pain.  Endocrine: Negative for polydipsia.  Skin: Negative for rash.  Neurological: Negative for dizziness, weakness and headaches.  Hematological: Does not bruise/bleed easily.  All other systems reviewed and are negative.      Objective:   Physical Exam Vitals and nursing note reviewed.  Cardiovascular:     Rate and Rhythm: Normal rate and regular rhythm.     Heart sounds: Normal heart sounds.  Pulmonary:     Breath sounds: Normal breath sounds.  Skin:    General: Skin is warm.  Neurological:     General: No  focal deficit present.     Mental Status: She is oriented to person, place, and time.  Psychiatric:        Mood and Affect: Mood normal.    BP 128/79   Pulse (!) 107   Temp 98.9 F (37.2 C) (Temporal)   Resp 20   Ht 5\' 5"  (1.651 m)   Wt 130 lb (59 kg)   SpO2 100%   BMI 21.63 kg/m         Assessment & Plan:  Pamela Richardson in today with chief complaint of Depression   1. Depression with anxiety Eat 3 good ,eals a day exercise Find something to look forward to daily Stress management Medication side effects discussed - escitalopram (LEXAPRO) 20 MG tablet; Take 1 tablet (20 mg total) by mouth daily.  Dispense: 30 tablet; Refill: 5   Mary-Margaret [name], FNP

## 2019-07-10 NOTE — Patient Instructions (Signed)
Stress, Adult Stress is a normal reaction to life events. Stress is what you feel when life demands more than you are used to, or more than you think you can handle. Some stress can be useful, such as studying for a test or meeting a deadline at work. Stress that occurs too often or for too long can cause problems. It can affect your emotional health and interfere with relationships and normal daily activities. Too much stress can weaken your body's defense system (immune system) and increase your risk for physical illness. If you already have a medical problem, stress can make it worse. What are the causes? All sorts of life events can cause stress. An event that causes stress for one person may not be stressful for another person. Major life events, whether positive or negative, commonly cause stress. Examples include:  Losing a job or starting a new job.  Losing a loved one.  Moving to a new town or home.  Getting married or divorced.  Having a baby.  Getting injured or sick. Less obvious life events can also cause stress, especially if they occur day after day or in combination with each other. Examples include:  Working long hours.  Driving in traffic.  Caring for children.  Being in debt.  Being in a difficult relationship. What are the signs or symptoms? Stress can cause emotional symptoms, including:  Anxiety. This is feeling worried, afraid, on edge, overwhelmed, or out of control.  Anger, including irritation or impatience.  Depression. This is feeling sad, down, helpless, or guilty.  Trouble focusing, remembering, or making decisions. Stress can cause physical symptoms, including:  Aches and pains. These may affect your head, neck, back, stomach, or other areas of your body.  Tight muscles or a clenched jaw.  Low energy.  Trouble sleeping. Stress can cause unhealthy behaviors, including:  Eating to feel better (overeating) or skipping meals.  Working too  much or putting off tasks.  Smoking, drinking alcohol, or using drugs to feel better. How is this diagnosed? Stress is diagnosed through an assessment by your health care provider. He or she may diagnose this condition based on:  Your symptoms and any stressful life events.  Your medical history.  Tests to rule out other causes of your symptoms. Depending on your condition, your health care provider may refer you to a specialist for further evaluation. How is this treated?  Stress management techniques are the recommended treatment for stress. Medicine is not typically recommended for the treatment of stress. Techniques to reduce your reaction to stressful life events include:  Stress identification. Monitor yourself for symptoms of stress and identify what causes stress for you. These skills may help you to avoid or prepare for stressful events.  Time management. Set your priorities, keep a calendar of events, and learn to say no. Taking these actions can help you avoid making too many commitments. Techniques for coping with stress include:  Rethinking the problem. Try to think realistically about stressful events rather than ignoring them or overreacting. Try to find the positives in a stressful situation rather than focusing on the negatives.  Exercise. Physical exercise can release both physical and emotional tension. The key is to find a form of exercise that you enjoy and do it regularly.  Relaxation techniques. These relax the body and mind. The key is to find one or more that you enjoy and use the techniques regularly. Examples include: ? Meditation, deep breathing, or progressive relaxation techniques. ? Yoga or   tai chi. ? Biofeedback, mindfulness techniques, or journaling. ? Listening to music, being out in nature, or participating in other hobbies.  Practicing a healthy lifestyle. Eat a balanced diet, drink plenty of water, limit or avoid caffeine, and get plenty of  sleep.  Having a strong support network. Spend time with family, friends, or other people you enjoy being around. Express your feelings and talk things over with someone you trust. Counseling or talk therapy with a mental health professional may be helpful if you are having trouble managing stress on your own. Follow these instructions at home: Lifestyle   Avoid drugs.  Do not use any products that contain nicotine or tobacco, such as cigarettes, e-cigarettes, and chewing tobacco. If you need help quitting, ask your health care provider.  Limit alcohol intake to no more than 1 drink a day for nonpregnant women and 2 drinks a day for men. One drink equals 12 oz of beer, 5 oz of wine, or 1 oz of hard liquor  Do not use alcohol or drugs to relax.  Eat a balanced diet that includes fresh fruits and vegetables, whole grains, lean meats, fish, eggs, and beans, and low-fat dairy. Avoid processed foods and foods high in added fat, sugar, and salt.  Exercise at least 30 minutes on 5 or more days each week.  Get 7-8 hours of sleep each night. General instructions   Practice stress management techniques as discussed with your health care provider.  Drink enough fluid to keep your urine clear or pale yellow.  Take over-the-counter and prescription medicines only as told by your health care provider.  Keep all follow-up visits as told by your health care provider. This is important. Contact a health care provider if:  Your symptoms get worse.  You have new symptoms.  You feel overwhelmed by your problems and can no longer manage them on your own. Get help right away if:  You have thoughts of hurting yourself or others. If you ever feel like you may hurt yourself or others, or have thoughts about taking your own life, get help right away. You can go to your nearest emergency department or call:  Your local emergency services (911 in the [place]  A suicide crisis helpline, such as the  [place] at (316) 250-6172. This is open 24 hours a day. Summary  Stress is a normal reaction to life events. It can cause problems if it happens too often or for too long.  Practicing stress management techniques is the best way to treat stress.  Counseling or talk therapy with a mental health professional may be helpful if you are having trouble managing stress on your own. This information is not intended to replace advice given to you by your health care provider. Make sure you discuss any questions you have with your health care provider. Document Revised: 01/05/2019 Document Reviewed: 07/28/2016 Elsevier Patient Education  [place].

## 2019-07-12 ENCOUNTER — Encounter: Payer: Self-pay | Admitting: Family Medicine

## 2019-07-12 ENCOUNTER — Other Ambulatory Visit: Payer: Self-pay

## 2019-07-12 ENCOUNTER — Ambulatory Visit (INDEPENDENT_AMBULATORY_CARE_PROVIDER_SITE_OTHER): Payer: Medicaid Other | Admitting: Family Medicine

## 2019-07-12 DIAGNOSIS — R110 Nausea: Secondary | ICD-10-CM | POA: Diagnosis not present

## 2019-07-12 DIAGNOSIS — T887XXA Unspecified adverse effect of drug or medicament, initial encounter: Secondary | ICD-10-CM | POA: Diagnosis not present

## 2019-07-12 MED ORDER — ONDANSETRON HCL 4 MG PO TABS: 4.0000 mg | ORAL_TABLET | Freq: Three times a day (TID) | ORAL | 0 refills | Status: DC | PRN

## 2019-07-12 NOTE — Progress Notes (Signed)
Virtual Visit via telephone Note Due to COVID-19 pandemic this visit was conducted virtually. This visit type was conducted due to national recommendations for restrictions regarding the COVID-19 Pandemic (e.g. social distancing, sheltering in place) in an effort to limit this patient's exposure and mitigate transmission in our community. All issues noted in this document were discussed and addressed.  A physical exam was not performed with this format.   I connected with Pamela Richardson on 07/12/2019 at 1010 by telephone and verified that I am speaking with the correct person using two identifiers. Pamela Richardson is currently located at home and partner is currently with them during visit. The provider, Monia Pouch, FNP is located in their office at time of visit.  I discussed the limitations, risks, security and privacy concerns of performing an evaluation and management service by telephone and the availability of in person appointments. I also discussed with the patient that there may be a patient responsible charge related to this service. The patient expressed understanding and agreed to proceed.  Subjective:  Patient ID: Pamela Richardson, female    DOB: [birth year], 19 y.o.   MRN: [id number]  Chief Complaint:  No chief complaint on file.   HPI: Pamela Richardson is a 19 y.o. female presenting on 07/12/2019 for No chief complaint on file.   Patient states she was recently prescribed Lexapro.  Patient states she started this last night.  States she developed nausea, chills, and insomnia.  Patient states she would like something to help with the side effects of the medications.  She denies chest pain, dizziness, weakness, confusion, abdominal pain, diarrhea, worsening depression, or shortness of breath.    Relevant past medical, surgical, family, and social history reviewed and updated as indicated.  Allergies and medications reviewed and updated.   Past Medical History:  Diagnosis Date  .  Dysmenorrhea 11/10/2015  . Menorrhagia with irregular cycle 11/10/2015    History reviewed. No pertinent surgical history.  Social History   Socioeconomic History  . Marital status: Single    Spouse name: Not on file  . Number of children: Not on file  . Years of education: Not on file  . Highest education level: Not on file  Occupational History  . Not on file  Tobacco Use  . Smoking status: Never Smoker  . Smokeless tobacco: Never Used  Substance and Sexual Activity  . Alcohol use: No  . Drug use: No  . Sexual activity: Yes    Birth control/protection: Pill  Other Topics Concern  . Not on file  Social History Narrative  . Not on file   Social Determinants of Health   Financial Resource Strain:   . Difficulty of Paying Living Expenses: Not on file  Food Insecurity:   . Worried About Charity fundraiser in the Last Year: Not on file  . Ran Out of Food in the Last Year: Not on file  Transportation Needs:   . Lack of Transportation (Medical): Not on file  . Lack of Transportation (Non-Medical): Not on file  Physical Activity:   . Days of Exercise per Week: Not on file  . Minutes of Exercise per Session: Not on file  Stress:   . Feeling of Stress : Not on file  Social Connections:   . Frequency of Communication with Friends and Family: Not on file  . Frequency of Social Gatherings with Friends and Family: Not on file  . Attends Religious Services: Not on file  . Active Member of Clubs  or Organizations: Not on file  . Attends Banker Meetings: Not on file  . Marital Status: Not on file  Intimate Partner Violence:   . Fear of Current or Ex-Partner: Not on file  . Emotionally Abused: Not on file  . Physically Abused: Not on file  . Sexually Abused: Not on file    Outpatient Encounter Medications as of 07/12/2019  Medication Sig  . escitalopram (LEXAPRO) 20 MG tablet Take 1 tablet (20 mg total) by mouth daily.  . medroxyPROGESTERone (DEPO-PROVERA) 150  MG/ML injection Inject 1 mL (150 mg total) into the muscle every 3 (three) months.  . ondansetron (ZOFRAN) 4 MG tablet Take 1 tablet (4 mg total) by mouth every 8 (eight) hours as needed for nausea or vomiting.   No facility-administered encounter medications on file as of 07/12/2019.    No Known Allergies  Review of Systems  Constitutional: Positive for chills and fatigue. Negative for activity change, appetite change, diaphoresis, fever and unexpected weight change.  HENT: Negative.   Eyes: Negative.  Negative for photophobia and visual disturbance.  Respiratory: Negative for cough, chest tightness and shortness of breath.   Cardiovascular: Negative for chest pain, palpitations and leg swelling.  Gastrointestinal: Positive for nausea. Negative for abdominal distention, abdominal pain, anal bleeding, blood in stool, constipation, diarrhea, rectal pain and vomiting.  Endocrine: Negative.   Genitourinary: Negative for decreased urine volume, difficulty urinating, dysuria, frequency and urgency.  Musculoskeletal: Positive for myalgias. Negative for arthralgias.  Skin: Negative.   Allergic/Immunologic: Negative.   Neurological: Negative for dizziness, tremors, seizures, syncope, facial asymmetry, speech difficulty, weakness, light-headedness, numbness and headaches.  Hematological: Negative.   Psychiatric/Behavioral: Positive for sleep disturbance. Negative for confusion, hallucinations and suicidal ideas.  All other systems reviewed and are negative.        Observations/Objective: No vital signs or physical exam, this was a telephone or virtual health encounter.  Pt alert and oriented, answers all questions appropriately, and able to speak in Richardson sentences.    Assessment and Plan: Diagnoses and all orders for this visit:  Medication side effects Nausea Medication side effects from initiation of Lexapro.  Patient aware it could take several doses of the medication for the body to  adjust.  Will provide Zofran as needed for nausea.  Patient can take Tylenol for chills and myalgias.  Patient aware to try dosing medication in the mornings versus at night to prevent insomnia.  Patient aware to report any new, worsening, or persistent symptoms.  Follow-up as needed. -     ondansetron (ZOFRAN) 4 MG tablet; Take 1 tablet (4 mg total) by mouth every 8 (eight) hours as needed for nausea or vomiting.     Follow Up Instructions: Return if symptoms worsen or fail to improve.    I discussed the assessment and treatment plan with the patient. The patient was provided an opportunity to ask questions and all were answered. The patient agreed with the plan and demonstrated an understanding of the instructions.   The patient was advised to call back or seek an in-person evaluation if the symptoms worsen or if the condition fails to improve as anticipated.  The above assessment and management plan was discussed with the patient. The patient verbalized understanding of and has agreed to the management plan. Patient is aware to call the clinic if they develop any new symptoms or if symptoms persist or worsen. Patient is aware when to return to the clinic for a follow-up visit. Patient educated  on when it is appropriate to go to the emergency department.    I provided 15 minutes of non-face-to-face time during this encounter. The call started at 1010. The call ended at 1025. The other time was used for coordination of care.    Kari Baars, FNP-C [facility] [address] [place], [place] [address] [phone] 07/12/2019

## 2019-07-18 ENCOUNTER — Encounter: Payer: Self-pay | Admitting: Advanced Practice Midwife

## 2019-07-18 ENCOUNTER — Ambulatory Visit (INDEPENDENT_AMBULATORY_CARE_PROVIDER_SITE_OTHER): Payer: Medicaid Other | Admitting: Advanced Practice Midwife

## 2019-07-18 ENCOUNTER — Other Ambulatory Visit: Payer: Self-pay

## 2019-07-18 VITALS — BP 134/90 | HR 108 | Ht 65.0 in | Wt 126.4 lb

## 2019-07-18 DIAGNOSIS — Z3009 Encounter for other general counseling and advice on contraception: Secondary | ICD-10-CM

## 2019-07-18 DIAGNOSIS — N946 Dysmenorrhea, unspecified: Secondary | ICD-10-CM

## 2019-07-18 MED ORDER — NORETHIN-ETH ESTRAD-FE BIPHAS 1 MG-10 MCG / 10 MCG PO TABS: 1.0000 | ORAL_TABLET | Freq: Every day | ORAL | 3 refills | Status: DC

## 2019-07-18 NOTE — Progress Notes (Signed)
   GYN VISIT Patient name: Pamela Richardson MRN [id number]  Date of birth: [birth year] Chief Complaint:   Discuss Other Birth Control (Currently on Depo)  History of Present Illness:   Pamela Richardson is a 19 y.o. G76P0000 Caucasian female being seen today for wanting to switch from DMPA to OCPs. Reports no interest in sex x 6 months (since starting DMPA); was also started on Lexapro recently and understands the risks of decreased libido.  Has been on OCPs before and had difficulty with cycle control, but would like to to Lo Loestrin as she had the best success with cycle control using that.   No LMP recorded. Patient has had an injection. The current method of family planning is Depo-Provera injections, but interest in changing; last DMPA 05/03/19.  Last pap <21yo.  Review of Systems:   Pertinent items are noted in HPI Denies fever/chills, dizziness, headaches, visual disturbances, fatigue, shortness of breath, chest pain, abdominal pain, vomiting, abnormal vaginal discharge/itching/odor/irritation, problems with periods, bowel movements, urination, or intercourse unless otherwise stated above.  Pertinent History Reviewed:  Reviewed past medical,surgical, social, obstetrical and family history.  Reviewed problem list, medications and allergies. Physical Assessment:   Vitals:   07/18/19 0835 07/18/19 0838  BP: 140/86 134/90  Pulse: 89 (!) 108  Weight: 126 lb 6.4 oz (57.3 kg)   Height: 5\' 5"  (1.651 m)   Body mass index is 21.03 kg/m.       Physical Examination:   General appearance: alert, well appearing, and in no distress  Mental status: alert, oriented to person, place, and time  Skin: warm & dry   Cardiovascular: normal heart rate noted  Respiratory: normal respiratory effort, no distress  Abdomen: soft, non-tender   Pelvic: normal external genitalia, vulva, vagina, cervix, uterus and adnexa, examination not indicated  Extremities: no edema   Chaperone: n/a    No results found for  this or any previous visit (from the past 24 hour(s)).  Assessment & Plan:  1) New start OCPs> hopeful for better cycle control and increased libido  2) Borderline BP> will evaluate at 36mo check  Meds:  Meds ordered this encounter  Medications  . Norethindrone-Ethinyl Estradiol-Fe Biphas (LO LOESTRIN FE) 1 MG-10 MCG / 10 MCG tablet    Sig: Take 1 tablet by mouth daily.    Dispense:  1 Package    Refill:  3    Order Specific Question:   Supervising Provider    Answer:   0mo [2398]    No orders of the defined types were placed in this encounter.   Return in about 2 months (around 09/15/2019) for F/U OCPs.  09/17/2019 CNM 07/18/2019 8:52 AM

## 2019-07-19 ENCOUNTER — Ambulatory Visit (INDEPENDENT_AMBULATORY_CARE_PROVIDER_SITE_OTHER): Payer: Medicaid Other | Admitting: Nurse Practitioner

## 2019-07-19 ENCOUNTER — Other Ambulatory Visit: Payer: Self-pay

## 2019-07-19 ENCOUNTER — Encounter (HOSPITAL_COMMUNITY): Payer: Self-pay

## 2019-07-19 ENCOUNTER — Encounter: Payer: Self-pay | Admitting: Nurse Practitioner

## 2019-07-19 ENCOUNTER — Emergency Department (HOSPITAL_COMMUNITY)
Admission: EM | Admit: 2019-07-19 | Discharge: 2019-07-20 | Disposition: A | Discharge status: Other Acute Inpatient Hospital | Payer: Medicaid Other | Attending: Emergency Medicine | Admitting: Emergency Medicine

## 2019-07-19 ENCOUNTER — Inpatient Hospital Stay (HOSPITAL_COMMUNITY): Admission: AD | Admit: 2019-07-19 | Payer: Medicaid Other | Source: Intra-hospital | Admitting: Psychiatry

## 2019-07-19 DIAGNOSIS — N921 Excessive and frequent menstruation with irregular cycle: Secondary | ICD-10-CM | POA: Insufficient documentation

## 2019-07-19 DIAGNOSIS — R45851 Suicidal ideations: Secondary | ICD-10-CM

## 2019-07-19 DIAGNOSIS — F32A Depression, unspecified: Secondary | ICD-10-CM

## 2019-07-19 DIAGNOSIS — F329 Major depressive disorder, single episode, unspecified: Secondary | ICD-10-CM

## 2019-07-19 DIAGNOSIS — Z793 Long term (current) use of hormonal contraceptives: Secondary | ICD-10-CM | POA: Diagnosis not present

## 2019-07-19 DIAGNOSIS — F332 Major depressive disorder, recurrent severe without psychotic features: Secondary | ICD-10-CM | POA: Insufficient documentation

## 2019-07-19 DIAGNOSIS — F419 Anxiety disorder, unspecified: Secondary | ICD-10-CM

## 2019-07-19 DIAGNOSIS — Z20822 Contact with and (suspected) exposure to covid-19: Secondary | ICD-10-CM | POA: Diagnosis not present

## 2019-07-19 LAB — CBC WITH DIFFERENTIAL/PLATELET
Abs Immature Granulocytes: 0.01 10*3/uL (ref 0.00–0.07)
Basophils Absolute: 0.1 10*3/uL (ref 0.0–0.1)
Basophils Relative: 1 %
Eosinophils Absolute: 0.1 10*3/uL (ref 0.0–0.5)
Eosinophils Relative: 2 %
HCT: 42.3 % (ref 36.0–46.0)
Hemoglobin: 13.8 g/dL (ref 12.0–15.0)
Immature Granulocytes: 0 %
Lymphocytes Relative: 27 %
Lymphs Abs: 2.1 10*3/uL (ref 0.7–4.0)
MCH: 28.9 pg (ref 26.0–34.0)
MCHC: 32.6 g/dL (ref 30.0–36.0)
MCV: 88.5 fL (ref 80.0–100.0)
Monocytes Absolute: 0.4 10*3/uL (ref 0.1–1.0)
Monocytes Relative: 6 %
Neutro Abs: 5.1 10*3/uL (ref 1.7–7.7)
Neutrophils Relative %: 64 %
Platelets: 319 10*3/uL (ref 150–400)
RBC: 4.78 MIL/uL (ref 3.87–5.11)
RDW: 12.9 % (ref 11.5–15.5)
WBC: 7.8 10*3/uL (ref 4.0–10.5)
nRBC: 0 % (ref 0.0–0.2)

## 2019-07-19 LAB — COMPREHENSIVE METABOLIC PANEL
ALT: 13 U/L (ref 0–44)
AST: 17 U/L (ref 15–41)
Albumin: 4.8 g/dL (ref 3.5–5.0)
Alkaline Phosphatase: 77 U/L (ref 38–126)
Anion gap: 11 (ref 5–15)
BUN: 8 mg/dL (ref 6–20)
CO2: 21 mmol/L — ABNORMAL LOW (ref 22–32)
Calcium: 9.2 mg/dL (ref 8.9–10.3)
Chloride: 106 mmol/L (ref 98–111)
Creatinine, Ser: 0.61 mg/dL (ref 0.44–1.00)
GFR calc Af Amer: >60 mL/min (ref >60)
GFR calc non Af Amer: >60 mL/min (ref >60)
Glucose, Bld: 99 mg/dL (ref 70–99)
Potassium: 3.7 mmol/L (ref 3.5–5.1)
Sodium: 138 mmol/L (ref 135–145)
Total Bilirubin: 0.5 mg/dL (ref 0.3–1.2)
Total Protein: 7.9 g/dL (ref 6.5–8.1)

## 2019-07-19 LAB — RAPID URINE DRUG SCREEN, HOSP PERFORMED
Amphetamines: NOT DETECTED
Barbiturates: NOT DETECTED
Benzodiazepines: NOT DETECTED
Cocaine: NOT DETECTED
Opiates: NOT DETECTED
Tetrahydrocannabinol: NOT DETECTED

## 2019-07-19 LAB — RESPIRATORY PANEL BY RT PCR (FLU A&B, COVID)
Influenza A by PCR: NEGATIVE
Influenza B by PCR: NEGATIVE
SARS Coronavirus 2 by RT PCR: NEGATIVE

## 2019-07-19 LAB — I-STAT BETA HCG BLOOD, ED (MC, WL, AP ONLY): I-stat hCG, quantitative: <5 m[IU]/mL (ref <5)

## 2019-07-19 NOTE — Progress Notes (Deleted)
Pt accepted to [facility] Adult Unit; 307-2.    Berneice Heinrich, NP is the accepting provider.    Dr. Jola Babinski is the attending provider.    Call report to [phone].     Gerilyn Pilgrim @ [facility] ED notified.     Pt is Voluntary.    Pt may be transported by [employer].   Pt scheduled to arrive at [facility] after 1:00am.   Drucilla Schmidt, MSW, LCSW-A Clinical Disposition Social Worker [facility]/TTS [phone]

## 2019-07-19 NOTE — ED Provider Notes (Signed)
[facility]-EMERGENCY DEPT Provider Note   CSN: [id number] Arrival date & time: 07/19/19  1539     History Chief Complaint  Patient presents with  . Suicidal    Pamela Richardson is a 19 y.o. female.  HPI   Patient presents to the emergency room for evaluation of worsening depression.  Patient has been having with depression since she was in high school.  She has never been on any medications.  Over the last several months she has had increasing symptoms.  Patient saw her family medicine doctor earlier this month.  She was started on Lexapro.  Patient has been seeing a Veterinary surgeon at school.  Unfortunately, her symptoms are worsening.  She has been having increasing suicidal thoughts.  She was thinking about jumping out of a second story window and possibly hanging herself.  She spoke with her doctor today who suggested she come to the ED for emergent evaluation.  Past Medical History:  Diagnosis Date  . Dysmenorrhea 11/10/2015  . Menorrhagia with irregular cycle 11/10/2015    Patient Active Problem List   Diagnosis Date Noted  . Anxiety and depression 07/19/2019  . Chronic idiopathic constipation 04/21/2016  . Dysmenorrhea 11/10/2015    History reviewed. No pertinent surgical history.   OB History    Gravida  0   Para  0   Term  0   Preterm  0   AB  0   Living  0     SAB  0   TAB  0   Ectopic  0   Multiple  0   Live Births              Family History  Problem Relation Age of Onset  . Scoliosis Mother   . Other Maternal Grandmother        suicide  . Hypertension Maternal Grandfather   . Diabetes Maternal Grandfather   . Cancer Maternal Grandfather   . Scoliosis Other     Social History   Tobacco Use  . Smoking status: Never Smoker  . Smokeless tobacco: Never Used  Substance Use Topics  . Alcohol use: No  . Drug use: No    Home Medications Prior to Admission medications   Medication Sig Start Date End Date Taking?  Authorizing Provider  escitalopram (LEXAPRO) 20 MG tablet Take 20 mg by mouth daily.   Yes [provider]  medroxyPROGESTERone (DEPO-PROVERA) 150 MG/ML injection Inject 1 mL (150 mg total) into the muscle every 3 (three) months. 02/08/19  Yes Cheral Marker, CNM    Allergies    Patient has no known allergies.  Review of Systems   Review of Systems  All other systems reviewed and are negative.   Physical Exam Updated Vital Signs BP 123/63   Pulse 81   Temp 98.6 F (37 C)   Resp 18   SpO2 99%   Physical Exam Vitals and nursing note reviewed.  Constitutional:      General: She is not in acute distress.    Appearance: She is well-developed.  HENT:     Head: Normocephalic and atraumatic.     Right Ear: External ear normal.     Left Ear: External ear normal.  Eyes:     General: No scleral icterus.       Right eye: No discharge.        Left eye: No discharge.     Conjunctiva/sclera: Conjunctivae normal.  Neck:     Trachea: No  tracheal deviation.  Cardiovascular:     Rate and Rhythm: Normal rate and regular rhythm.  Pulmonary:     Effort: Pulmonary effort is normal. No respiratory distress.     Breath sounds: Normal breath sounds. No stridor. No wheezing or rales.  Abdominal:     General: Bowel sounds are normal. There is no distension.     Palpations: Abdomen is soft.     Tenderness: There is no abdominal tenderness. There is no guarding or rebound.  Musculoskeletal:        General: No tenderness.     Cervical back: Neck supple.  Skin:    General: Skin is warm and dry.     Findings: No rash.  Neurological:     Mental Status: She is alert.     Cranial Nerves: No cranial nerve deficit (no facial droop, extraocular movements intact, no slurred speech).     Sensory: No sensory deficit.     Motor: No abnormal muscle tone or seizure activity.     Coordination: Coordination normal.  Psychiatric:        Mood and Affect: Mood is depressed.        Behavior:  Behavior is withdrawn. Behavior is not agitated or aggressive.        Thought Content: Thought content includes suicidal ideation. Thought content includes suicidal plan.     ED Results / Procedures / Treatments   Labs (all labs ordered are listed, but only abnormal results are displayed) Labs Reviewed  COMPREHENSIVE METABOLIC PANEL - Abnormal; Notable for the following components:      Result Value   CO2 21 (*)    All other components within normal limits  RESPIRATORY PANEL BY RT PCR (FLU A&B, COVID)  RAPID URINE DRUG SCREEN, HOSP PERFORMED  CBC WITH DIFFERENTIAL/PLATELET  I-STAT BETA HCG BLOOD, ED ([facility], [facility], [facility] ONLY)    EKG None  Radiology No results found.  Procedures Procedures (including critical care time)  Medications Ordered in ED Medications - No data to display  ED Course  I have reviewed the triage vital signs and the nursing notes.  Pertinent labs & imaging results that were available during my care of the patient were reviewed by me and considered in my medical decision making (see chart for details).    MDM Rules/Calculators/A&P                      Pt presented with SI.  No acute medical issues.  Evaluated by TTS.  Transferred for inpatient psychiatric care. Final Clinical Impression(s) / ED Diagnoses Final diagnoses:  Suicidal ideation    Rx / DC Orders ED Discharge Orders    None       Dorie Rank, MD 07/23/19 [phone]

## 2019-07-19 NOTE — ED Notes (Signed)
Pt undressed and changed into burgundy scrubs. Personal items collected and placed in personal belongings bag- including mobile phone, earrings, shoes, sweater, shirt, pants, water cooler, purse. Per pt request all items listed given to family/friend Docia Barrier in ED lobby. RN advised. [employer]

## 2019-07-19 NOTE — ED Notes (Signed)
Pt asked to change into scrubs, pt states that she has been here almost 8 hours and that if she is not getting a room at [facility] she wants to leave and is not changing into scrubs.

## 2019-07-19 NOTE — ED Notes (Signed)
Pt refuses to change. Provider made aware that patient states that she is going to leave.

## 2019-07-19 NOTE — ED Triage Notes (Signed)
Pt presents with c/o suicidal thoughts. Pt reports she has been on Lexapro for approx a week and 2 days and does not feel like the medication is helping. Pt reports she feels as if her symptoms are in fact getting worse. Pt reports that she has been experiencing some right arm pain as well, some sweating, fever, and high blood pressure. Pt reports she is restless and having difficulty sleeping.

## 2019-07-19 NOTE — Progress Notes (Signed)
Patient meets inpatient criteria per Berneice Heinrich, NP. There are not any beds currently at [facility]. The patient has been faxed out to the following facilities for review:   [facility]    [facility]    [facility]   [facility]   [facility]   [facility]  [facility]    [facility]    [facility]    [facility]   [facility]   [facility]   [facility]   [facility]   [facility]  [facility]  CSW will continue to follow and assist with disposition planning.   Drucilla Schmidt, MSW, LCSW-A Clinical Disposition Social Worker [facility]/TTS [phone]

## 2019-07-19 NOTE — Progress Notes (Signed)
Virtual Visit via telephone Note Due to COVID-19 pandemic this visit was conducted virtually. This visit type was conducted due to national recommendations for restrictions regarding the COVID-19 Pandemic (e.g. social distancing, sheltering in place) in an effort to limit this patient's exposure and mitigate transmission in our community. All issues noted in this document were discussed and addressed.  A physical exam was not performed with this format.  I connected with Pamela Richardson on 07/19/19 at 2:00 by telephone and verified that I am speaking with the correct person using two identifiers. Pamela Richardson is currently located at work and no one is currently with her during visit. The provider, Mary-Margaret Hassell Done, FNP is located in their office at time of visit.  I discussed the limitations, risks, security and privacy concerns of performing an evaluation and management service by telephone and the availability of in person appointments. I also discussed with the patient that there may be a patient responsible charge related to this service. The patient expressed understanding and agreed to proceed.   History and Present Illness:   Chief Complaint: Medical Management of Chronic Issues    HPI:  1. Anxiety and depression Patient was recently put on lexapro and says she is not doing well. She says that the lexapro has been causing headache, confusion, trouble telling reality from fantasy. She has ben tired. Doesn't want to do anything. Has increase in sucidal thoughts. She says that she does would not go through with it. lexapro is the only thing she has been on.  Depression screen [facility] 2/9 07/19/2019 07/10/2019 11/01/2018  Decreased Interest 3 3 0  Down, Depressed, Hopeless 3 3 0  PHQ - 2 Score 6 6 0  Altered sleeping 3 3 -  Tired, decreased energy 2 3 -  Change in appetite 3 1 -  Feeling bad or failure about yourself  3 3 -  Trouble concentrating 3 3 -  Moving slowly or fidgety/restless 2  1 -  Suicidal thoughts 1 3 -  PHQ-9 Score 23 23 -  Difficult doing work/chores Very difficult Not difficult at all -      Outpatient Encounter Medications as of 07/19/2019  Medication Sig  . escitalopram (LEXAPRO) 20 MG tablet Take 1 tablet (20 mg total) by mouth daily.  . medroxyPROGESTERone (DEPO-PROVERA) 150 MG/ML injection Inject 1 mL (150 mg total) into the muscle every 3 (three) months.  . Norethindrone-Ethinyl Estradiol-Fe Biphas (LO LOESTRIN FE) 1 MG-10 MCG / 10 MCG tablet Take 1 tablet by mouth daily.   No facility-administered encounter medications on file as of 07/19/2019.    History reviewed. No pertinent surgical history.  Family History  Problem Relation Age of Onset  . Scoliosis Mother   . Other Maternal Grandmother        suicide  . Hypertension Maternal Grandfather   . Diabetes Maternal Grandfather   . Cancer Maternal Grandfather   . Scoliosis Other     New complaints: None today  Social history: Lives with her fiance  Controlled substance contract: n/a    Review of Systems  Constitutional: Negative.   Psychiatric/Behavioral: Positive for depression. The patient is nervous/anxious.   All other systems reviewed and are negative.    Observations/Objective: Alert and oriented- answers all questions appropriately Voice is very soft and slow Sounds very down Stating she is having suicidal thoughts    Assessment and Plan: Pamela Richardson in today with chief complaint of Medical Management of Chronic Issues   1. Anxiety and depression  2. suicidal ideation To ER for in take- said she would call her boyfriend to take her to the ER I attempted to contact her aunt who is on her HIPPA form but could not reach her.  * she also has fever and body aches that I think she needs to be tested for covid.  Follow Up Instructions: prn    I discussed the assessment and treatment plan with the patient. The patient was provided an opportunity to ask questions  and all were answered. The patient agreed with the plan and demonstrated an understanding of the instructions.   The patient was advised to call back or seek an in-person evaluation if the symptoms worsen or if the condition fails to improve as anticipated.  The above assessment and management plan was discussed with the patient. The patient verbalized understanding of and has agreed to the management plan. Patient is aware to call the clinic if symptoms persist or worsen. Patient is aware when to return to the clinic for a follow-up visit. Patient educated on when it is appropriate to go to the emergency department.   Time call ended:  2:30  I provided 30 minutes of non-face-to-face time during this encounter.    Mary-Margaret Daphine Deutscher, FNP
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
## 2019-07-19 NOTE — Progress Notes (Signed)
CSW attempted to call and speak with the RN. CSW was kept on-hold for 15 minutes. CSW Buyer, retail through secure chat requesting new vitals for the patient.   [facility] is currently reviewing the patient for bed placement.   CSW will continue to follow and assist with disposition planning.   Drucilla Schmidt, MSW, LCSW-A Clinical Disposition Social Worker [facility]/TTS [phone]

## 2019-07-19 NOTE — ED Provider Notes (Signed)
Received patient in signout from Dr. Lynelle Doctor.  Briefly patient is a 19 year old female here for increasing depression despite PCP evaluation and initiation of antidepressants.  Awaiting lab work.  Lab work without significant abnormality.  Electrolyte abnormality not pregnant.patient does meet criteria for inpatient hospitalization they are currently looking for placement   Melene Plan, DO 07/19/19 2206

## 2019-07-20 DIAGNOSIS — F4310 Post-traumatic stress disorder, unspecified: Secondary | ICD-10-CM | POA: Diagnosis not present

## 2019-07-20 NOTE — ED Notes (Signed)
[facility] is aware of pending transport.

## 2019-07-20 NOTE — ED Notes (Addendum)
Patient is requesting to see patient advocate.  Patient experience card was given.

## 2019-07-20 NOTE — ED Notes (Signed)
Info PT room door have to be open at all times

## 2019-07-20 NOTE — ED Notes (Signed)
PT upset due to writer removing room phone state there are other cords in the room she could use. Monitor cords have been taken out for PT safety. PT request to speak with charge

## 2019-07-20 NOTE — [facility] Counselor (Signed)
This counselor spoke with [place] at [facility].  [facility] accepted to [name] Accepting MD is Dr. Roselyn Reef Patient to arrive after 10:00 AM Check into [facility] Building for temperature check Call report to [phone]  Requesting [facility] send IVC paper work and provider notes to [phone].

## 2019-07-26 ENCOUNTER — Ambulatory Visit: Payer: Medicaid Other

## 2019-07-26 DIAGNOSIS — F331 Major depressive disorder, recurrent, moderate: Secondary | ICD-10-CM | POA: Diagnosis not present

## 2019-07-31 ENCOUNTER — Ambulatory Visit: Payer: Medicaid Other | Admitting: Nurse Practitioner

## 2019-08-02 DIAGNOSIS — F4322 Adjustment disorder with anxiety: Secondary | ICD-10-CM | POA: Diagnosis not present

## 2019-08-02 DIAGNOSIS — F331 Major depressive disorder, recurrent, moderate: Secondary | ICD-10-CM | POA: Diagnosis not present

## 2019-08-02 DIAGNOSIS — F439 Reaction to severe stress, unspecified: Secondary | ICD-10-CM | POA: Diagnosis not present

## 2019-09-12 ENCOUNTER — Ambulatory Visit: Payer: Medicaid Other | Admitting: Advanced Practice Midwife

## 2019-09-24 ENCOUNTER — Encounter: Payer: Self-pay | Admitting: Advanced Practice Midwife

## 2019-09-24 ENCOUNTER — Other Ambulatory Visit: Payer: Self-pay

## 2019-09-24 ENCOUNTER — Ambulatory Visit (INDEPENDENT_AMBULATORY_CARE_PROVIDER_SITE_OTHER): Payer: Medicaid Other | Admitting: Advanced Practice Midwife

## 2019-09-24 VITALS — BP 129/83 | HR 81 | Wt 136.0 lb

## 2019-09-24 DIAGNOSIS — F329 Major depressive disorder, single episode, unspecified: Secondary | ICD-10-CM | POA: Diagnosis not present

## 2019-09-24 DIAGNOSIS — Z30015 Encounter for initial prescription of vaginal ring hormonal contraceptive: Secondary | ICD-10-CM | POA: Diagnosis not present

## 2019-09-24 DIAGNOSIS — F32A Depression, unspecified: Secondary | ICD-10-CM

## 2019-09-24 DIAGNOSIS — F419 Anxiety disorder, unspecified: Secondary | ICD-10-CM

## 2019-09-24 MED ORDER — ETONOGESTREL-ETHINYL ESTRADIOL 0.12-0.015 MG/24HR VA RING: VAGINAL_RING | VAGINAL | 12 refills | Status: AC

## 2019-09-24 NOTE — Progress Notes (Signed)
[facility] Clinic Visit  Patient name: Pamela Richardson MRN [id number]  Date of birth: Mar 06, 2001  CC & HPI:  Pamela Richardson is a 19 y.o. Caucasian female presenting today for OCP check.  Switched from Depo in January (low libido, increased depression; also started lexapro and 2 other meds, quit those). Feeling better mentally since stopping Depo; still has depression/anxiety probably from PTDS  Discussed EMDR vs conventional therapy. Still not much interest in sex.  Can't remember to take pills.  Wants to try Nuva Ring.  Pertinent History Reviewed:  Medical & Surgical Hx:   Past Medical History:  Diagnosis Date  . Dysmenorrhea 11/10/2015  . Menorrhagia with irregular cycle 11/10/2015   History reviewed. No pertinent surgical history. Family History  Problem Relation Age of Onset  . Scoliosis Mother   . Other Maternal Grandmother        suicide  . Hypertension Maternal Grandfather   . Diabetes Maternal Grandfather   . Cancer Maternal Grandfather   . Scoliosis Other     Current Outpatient Medications:  .  Norethindrone-Ethinyl Estradiol-Fe Biphas (LO LOESTRIN FE) 1 MG-10 MCG / 10 MCG tablet, Take 1 tablet by mouth daily., Disp: , Rfl:  .  escitalopram (LEXAPRO) 20 MG tablet, Take 20 mg by mouth daily., Disp: , Rfl:  Social History: Reviewed -  reports that she has never smoked. She has never used smokeless tobacco.  Review of Systems:   Constitutional: Negative for fever and chills Eyes: Negative for visual disturbances Respiratory: Negative for shortness of breath, dyspnea Cardiovascular: Negative for chest pain or palpitations  Gastrointestinal: Negative for vomiting, diarrhea and constipation; no abdominal pain Genitourinary: Negative for dysuria and urgency, vaginal irritation or itching Musculoskeletal: Negative for back pain, joint pain, myalgias  Neurological: Negative for dizziness and headaches    Objective Findings:    Physical Examination: Vitals:   09/24/19 1432   BP: 129/83  Pulse: 81   General appearance - well appearing, and in no distress Mental status - alert, oriented to person, place, and time Chest:  Normal respiratory effort Heart - normal rate and regular rhythm Abdomen:  Soft, nontender Pelvic: deferred Musculoskeletal:  Normal range of motion without pain Extremities:  No edema    No results found for this or any previous visit (from the past 24 hour(s))    Assessment & Plan:  A:   Contraception mgt  PTSD/depression P:  Change to nuva ring: In on the first and out on the 25th, can use continuously.   To look into EMDR; will refer to Carley Hammed in [place] for that or online therapy. Pt will send mychart message letting me know which to refer/how NR is going    No follow-ups on file.  Jacklyn Shell CNM 09/24/2019 2:36 PM

## 2019-09-24 NOTE — Patient Instructions (Signed)
EMDR (eye movement desensitization and reprocessing).

## 2019-10-01 DIAGNOSIS — Z23 Encounter for immunization: Secondary | ICD-10-CM | POA: Diagnosis not present

## 2019-10-29 DIAGNOSIS — Z23 Encounter for immunization: Secondary | ICD-10-CM | POA: Diagnosis not present

## 2020-02-08 ENCOUNTER — Ambulatory Visit (INDEPENDENT_AMBULATORY_CARE_PROVIDER_SITE_OTHER): Payer: Medicaid Other

## 2020-02-08 ENCOUNTER — Other Ambulatory Visit: Payer: Self-pay

## 2020-02-08 ENCOUNTER — Ambulatory Visit (INDEPENDENT_AMBULATORY_CARE_PROVIDER_SITE_OTHER): Payer: Medicaid Other | Admitting: Nurse Practitioner

## 2020-02-08 ENCOUNTER — Encounter: Payer: Self-pay | Admitting: Nurse Practitioner

## 2020-02-08 VITALS — BP 114/63 | HR 90 | Temp 97.7°F | Ht 65.0 in | Wt 134.8 lb

## 2020-02-08 DIAGNOSIS — M25561 Pain in right knee: Secondary | ICD-10-CM | POA: Diagnosis not present

## 2020-02-08 DIAGNOSIS — M25461 Effusion, right knee: Secondary | ICD-10-CM | POA: Diagnosis not present

## 2020-02-08 MED ORDER — NAPROXEN 500 MG PO TABS: 500.0000 mg | ORAL_TABLET | Freq: Two times a day (BID) | ORAL | 0 refills | Status: AC

## 2020-02-08 NOTE — Progress Notes (Signed)
Acute Office Visit  Subjective:    Patient ID: Pamela Richardson, female    DOB: [birth year], 19 y.o.   MRN: [id number]  Chief Complaint  Patient presents with  . Knee Pain    right x 1 week, no known injury    Knee Pain  The incident occurred more than 1 week ago. There was no injury mechanism. The pain is present in the right knee. The quality of the pain is described as aching. The pain is at a severity of 7/10. The pain is severe. The pain has been constant since onset. Associated symptoms include an inability to bear weight. She reports no foreign bodies present. The symptoms are aggravated by movement. She has tried acetaminophen for the symptoms. The treatment provided mild relief.     Past Medical History:  Diagnosis Date  . Dysmenorrhea 11/10/2015  . Menorrhagia with irregular cycle 11/10/2015    History reviewed. No pertinent surgical history.  Family History  Problem Relation Age of Onset  . Scoliosis Mother   . Other Maternal Grandmother        suicide  . Hypertension Maternal Grandfather   . Diabetes Maternal Grandfather   . Cancer Maternal Grandfather   . Scoliosis Other     Social History   Socioeconomic History  . Marital status: Single    Spouse name: Not on file  . Number of children: Not on file  . Years of education: Not on file  . Highest education level: Not on file  Occupational History  . Not on file  Tobacco Use  . Smoking status: Never Smoker  . Smokeless tobacco: Never Used  Vaping Use  . Vaping Use: Never used  Substance and Sexual Activity  . Alcohol use: No  . Drug use: No  . Sexual activity: Yes    Birth control/protection: Pill  Other Topics Concern  . Not on file  Social History Narrative  . Not on file     Outpatient Medications Prior to Visit  Medication Sig Dispense Refill  . etonogestrel-ethinyl estradiol (NUVARING) 0.12-0.015 MG/24HR vaginal ring Place in vagina on the first of every month and remove on the 25th day 1  each 12  . escitalopram (LEXAPRO) 20 MG tablet Take 20 mg by mouth daily. (Patient not taking: Reported on 02/08/2020)    . Norethindrone-Ethinyl Estradiol-Fe Biphas (LO LOESTRIN FE) 1 MG-10 MCG / 10 MCG tablet Take 1 tablet by mouth daily. (Patient not taking: Reported on 02/08/2020)     No facility-administered medications prior to visit.    No Known Allergies  Review of Systems  Constitutional: Negative.   HENT: Negative.   Cardiovascular: Negative.   Gastrointestinal: Negative.   Genitourinary: Negative.   Musculoskeletal: Positive for joint swelling.  Skin: Negative.   Psychiatric/Behavioral: The patient is not nervous/anxious.        Objective:    Physical Exam Vitals reviewed.  Constitutional:      Appearance: Normal appearance.  HENT:     Head: Normocephalic.     Nose: Nose normal.  Eyes:     Conjunctiva/sclera: Conjunctivae normal.  Cardiovascular:     Rate and Rhythm: Normal rate and regular rhythm.     Pulses: Normal pulses.     Heart sounds: Normal heart sounds.  Pulmonary:     Effort: Pulmonary effort is normal.     Breath sounds: Normal breath sounds.  Abdominal:     General: Bowel sounds are normal.  Musculoskeletal:  General: Swelling and tenderness present.  Skin:    General: Skin is warm.  Neurological:     Mental Status: She is alert and oriented to person, place, and time.  Psychiatric:        Mood and Affect: Mood normal.     BP 114/63   Pulse 90   Temp 97.7 F (36.5 C)   Ht 5\' 5"  (1.651 m)   Wt 134 lb 12.8 oz (61.1 kg)   SpO2 100%   BMI 22.43 kg/m  Wt Readings from Last 3 Encounters:  02/08/20 134 lb 12.8 oz (61.1 kg)  09/24/19 136 lb (61.7 kg)  07/18/19 126 lb 6.4 oz (57.3 kg)    Health Maintenance Due  Topic Date Due  . Hepatitis C Screening  Never done  . HIV Screening  Never done  . TETANUS/TDAP  Never done  . CHLAMYDIA SCREENING  08/04/2019  . INFLUENZA VACCINE  01/20/2020    There are no preventive care  reminders to display for this patient.   No results found for: TSH Lab Results  Component Value Date   WBC 7.8 07/19/2019   HGB 13.8 07/19/2019   HCT 42.3 07/19/2019   MCV 88.5 07/19/2019   PLT 319 07/19/2019   Lab Results  Component Value Date   NA 138 07/19/2019   K 3.7 07/19/2019   CO2 21 (L) 07/19/2019   GLUCOSE 99 07/19/2019   BUN 8 07/19/2019   CREATININE 0.61 07/19/2019   BILITOT 0.5 07/19/2019   ALKPHOS 77 07/19/2019   AST 17 07/19/2019   ALT 13 07/19/2019   PROT 7.9 07/19/2019   ALBUMIN 4.8 07/19/2019   CALCIUM 9.2 07/19/2019   ANIONGAP 11 07/19/2019       Assessment & Plan:   Problem List Items Addressed This Visit      Other   Acute pain of right knee - Primary    Patient is a 19 year old female who presents to clinic with right acute knee pain.  Patient reports pain has been present in high school but stopped and then about 2 weeks ago she started having pain in the right knee, patient rates pain a 7 out of 10 on the pain scale of 0-10.  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  Patient denies any, trauma , falls or accident.  Pain is constant and worse with movement.  Patient has tried Tylenol for symptoms with only mild relief.  On assessment knee is swollen with and  warm to touch. X-ray of the right knee ordered. Advised patient to use naproxen 500 mg twice daily for inflammation and pain.  Ice application and immobilize knee.  Patient refused pain shot.  Follow-up with unresolved or worsening symptoms.  Education provided to patient with printed handout given. Rx sent to pharmacy.      Relevant Medications   naproxen (NAPROSYN) 500 MG tablet   Other Relevant Orders   DG Knee 1-2 Views Right       Meds ordered this encounter  Medications  . naproxen (NAPROSYN) 500 MG tablet    Sig: Take 1 tablet (500 mg total) by mouth 2 (two) times daily with a meal.    Dispense:  30 tablet    Refill:  0     Order Specific Question:   Supervising Provider    Answer:   [address]     [[id number]], NP

## 2020-02-08 NOTE — Assessment & Plan Note (Signed)
Patient is a 19 year old female who presents to clinic with right acute knee pain.  Patient reports pain has been present in high school but stopped and then about 2 weeks ago she started having pain in the right knee, patient rates pain a 7 out of 10 on the pain scale of 0-10.  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  Patient denies any, trauma , falls or accident.  Pain is constant and worse with movement.  Patient has tried Tylenol for symptoms with only mild relief.  On assessment knee is swollen with and  warm to touch. X-ray of the right knee ordered. Advised patient to use naproxen 500 mg twice daily for inflammation and pain.  Ice application and immobilize knee.  Patient refused pain shot.  Follow-up with unresolved or worsening symptoms.  Education provided to patient with printed handout given. Rx sent to pharmacy.

## 2020-02-08 NOTE — Patient Instructions (Signed)

## 2020-02-10 ENCOUNTER — Other Ambulatory Visit: Payer: Self-pay | Admitting: Nurse Practitioner

## 2020-02-10 DIAGNOSIS — M25561 Pain in right knee: Secondary | ICD-10-CM

## 2020-02-10 MED ORDER — PREDNISONE 10 MG (21) PO TBPK: ORAL_TABLET | ORAL | 0 refills | Status: AC

## 2020-02-11 ENCOUNTER — Telehealth: Payer: Self-pay | Admitting: Nurse Practitioner

## 2020-02-11 ENCOUNTER — Telehealth: Payer: Self-pay

## 2020-02-11 DIAGNOSIS — H5213 Myopia, bilateral: Secondary | ICD-10-CM | POA: Diagnosis not present

## 2020-02-11 NOTE — Telephone Encounter (Signed)
Patient states she reviewed her results on mychart for her knee x ray. States she would like a return to work note for 02/15/20- Is this okay?   Left proximal tibial osteochondroma. Patient would like more of an explanation of what this means and if there is something she needs to do ?  Please advise

## 2020-02-11 NOTE — Telephone Encounter (Signed)
Yes its okay to give the patient a work note to return 02/15/2020.  Patient can come by the office to pick it up.  An osteochondroma is a benign noncancerous tumor that has both bone and cartilage in it.  It can occur on any bone part of the body but hers is located at the proximal tibial.  It usually has no symptoms but can be evaluated further by an orthopedic doctor.

## 2020-02-11 NOTE — Telephone Encounter (Signed)
Letter written and on MyChart for pt to print off and advised pt of provider feedback and pt voiced understanding.

## 2020-02-11 NOTE — Telephone Encounter (Signed)
Pt aware of xray results

## 2020-02-28 ENCOUNTER — Ambulatory Visit (INDEPENDENT_AMBULATORY_CARE_PROVIDER_SITE_OTHER): Payer: Medicaid Other | Admitting: Orthopaedic Surgery

## 2020-02-28 ENCOUNTER — Other Ambulatory Visit: Payer: Self-pay

## 2020-02-28 DIAGNOSIS — M25561 Pain in right knee: Secondary | ICD-10-CM

## 2020-02-28 NOTE — Progress Notes (Signed)
Office Visit Note   Patient: Pamela Richardson           Date of Birth: 05-Mar-2001           MRN: [id number] Visit Date: 02/28/2020              Requested by: Daryll Drown, NP [address] [place],  [place] [address] PCP: Bennie Pierini, FNP   Assessment & Plan: Visit Diagnoses:  1. Acute pain of right knee     Plan: At this point a MRI of the right knee is warranted to rule out a meniscal tear and assess for a plica and even impingement of Hoffa's fat pad.  She does have a mild effusion and given her clinical exam findings and the failure of conservative treatment for over 3 years now combined with recurrent effusions, a MRI of the right knee is now medically warranted.  All question concerns were answered and addressed.  We will see her back after this MRI.  Follow-Up Instructions: Return in about 2 weeks (around 03/13/2020).   Orders:  No orders of the defined types were placed in this encounter.  No orders of the defined types were placed in this encounter.     Procedures: No procedures performed   Clinical Data: No additional findings.   Subjective: Chief Complaint  Patient presents with  . Right Knee - Pain  The patient is a 19 year old female who comes in with a 2 to 3-year history of worsening right knee pain.  There are x-rays of her right knee on the canopy system for me to review.  She reports intermittent knee swelling as well as locking catching and popping.  She says her left knee does not bother her at all.  She does have a known osteochondroma of the left knee medially which she says is prominent and she can feel but is never bothered her at all.  She does work as an Materials engineer and she said that is really causing her knee to flareup even more in terms of pain when she squats.  She played a lot of sports in high school including volleyball and has had issues with that knee since then.  She reports locking and catching with her right  knee.  HPI  Review of Systems She currently denies any headache, chest pain, shortness of breath, fever, chills, nausea, vomiting  Objective: Vital Signs: There were no vitals taken for this visit.  Physical Exam She is alert and orient x3 and in no acute distress Ortho Exam Examination of her left knee shows no effusion.  Her knee range of motion is full.  There is a prominent area consistent with an osteochondroma on the medial aspect of the knee but it is asymptomatic.  Her right knee actually shows a mild effusion.  She has pain at the patella tendon and around Hoffa's fat pad area.  There is signs of a medial plica and impingement as the patella glides over the trochlear groove.  There is significant grinding as well. Specialty Comments:  No specialty comments available.  Imaging: No results found. X-rays on the canopy system independent reviewed of the right knee show no acute findings.  The left knee does have a medial osteochondroma  PMFS History: Patient Active Problem List   Diagnosis Date Noted  . Acute pain of right knee 02/08/2020  . Anxiety and depression 07/19/2019  . Chronic idiopathic constipation 04/21/2016  . Dysmenorrhea 11/10/2015   Past Medical History:  Diagnosis Date  . Dysmenorrhea 11/10/2015  . Menorrhagia with irregular cycle 11/10/2015    Family History  Problem Relation Age of Onset  . Scoliosis Mother   . Other Maternal Grandmother        suicide  . Hypertension Maternal Grandfather   . Diabetes Maternal Grandfather   . Cancer Maternal Grandfather   . Scoliosis Other     No past surgical history on file. Social History   Occupational History  . Not on file  Tobacco Use  . Smoking status: Never Smoker  . Smokeless tobacco: Never Used  Vaping Use  . Vaping Use: Never used  Substance and Sexual Activity  . Alcohol use: No  . Drug use: No  . Sexual activity: Yes    Birth control/protection: Pill

## 2020-03-13 ENCOUNTER — Ambulatory Visit: Payer: Medicaid Other | Admitting: Orthopaedic Surgery

## 2020-03-20 ENCOUNTER — Ambulatory Visit
Admission: RE | Admit: 2020-03-20 | Discharge: 2020-03-20 | Disposition: A | Discharge status: Home / Self Care | Payer: Medicaid Other | Source: Ambulatory Visit | Attending: Orthopaedic Surgery | Admitting: Orthopaedic Surgery

## 2020-03-20 DIAGNOSIS — M25561 Pain in right knee: Secondary | ICD-10-CM | POA: Diagnosis not present

## 2020-03-26 ENCOUNTER — Encounter: Payer: Self-pay | Admitting: Orthopaedic Surgery

## 2020-03-26 ENCOUNTER — Ambulatory Visit (INDEPENDENT_AMBULATORY_CARE_PROVIDER_SITE_OTHER): Payer: Medicaid Other | Admitting: Orthopaedic Surgery

## 2020-03-26 DIAGNOSIS — M25561 Pain in right knee: Secondary | ICD-10-CM | POA: Diagnosis not present

## 2020-03-26 NOTE — Progress Notes (Signed)
The patient comes in today to go over an MRI of her right knee.  She has been having global knee pain for some time.  She is on 19 years old.  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  She still reports right knee pain.  On exam the right knee is stable on exam with excellent range of motion no effusion.  It is painful but its arc of motion.  MRI is reviewed with her and is basically entirely normal.  There is no significant findings on the MRI.  There is maybe a small signal in the posterior lateral tibial plateau in the anterior lateral femur but those are minimal and she is not hurting in these areas.  I have recommended quad strengthening exercises.  I will offer her a steroid injection in her knee but she deferred this.  All question concerns were answered and addressed.  Follow-up can be as needed.

## 2021-03-09 ENCOUNTER — Other Ambulatory Visit: Payer: Self-pay

## 2021-03-09 ENCOUNTER — Emergency Department
Admission: EM | Admit: 2021-03-09 | Discharge: 2021-03-09 | Disposition: A | Discharge status: Home / Self Care | Payer: Self-pay | Source: Home / Self Care

## 2021-03-09 DIAGNOSIS — J029 Acute pharyngitis, unspecified: Secondary | ICD-10-CM

## 2021-03-09 DIAGNOSIS — J309 Allergic rhinitis, unspecified: Secondary | ICD-10-CM

## 2021-03-09 DIAGNOSIS — J039 Acute tonsillitis, unspecified: Secondary | ICD-10-CM

## 2021-03-09 LAB — POCT RAPID STREP A (OFFICE): Rapid Strep A Screen: NEGATIVE

## 2021-03-09 MED ORDER — CEFDINIR 300 MG PO CAPS: 300.0000 mg | ORAL_CAPSULE | Freq: Two times a day (BID) | ORAL | 0 refills | Status: AC

## 2021-03-09 NOTE — Discharge Instructions (Addendum)
Advised/instructed patient to take medication as directed with food to completion.  Advised patient to take OTC Allegra 180 mg without D daily for the next 5 days for concurrent postnasal drip/drainage.  Encouraged patient to increase daily water intake while taking these medications.  Work note provided prior to discharge.

## 2021-03-09 NOTE — ED Provider Notes (Signed)
[facility]    CSN: [id number] Arrival date & time: 03/09/21  1515      History   Chief Complaint Chief Complaint  Patient presents with   Cough   Nasal Congestion    HPI Pamela Richardson is a 20 y.o. female.   HPI 20 year old female presents with ST, nasal congestion, and postnasal drainage for 3 days.  Past Medical History:  Diagnosis Date   Dysmenorrhea 11/10/2015   Menorrhagia with irregular cycle 11/10/2015    Patient Active Problem List   Diagnosis Date Noted   Acute pain of right knee 02/08/2020   Anxiety and depression 07/19/2019   Chronic idiopathic constipation 04/21/2016   Dysmenorrhea 11/10/2015    History reviewed. No pertinent surgical history.  OB History     Gravida  0   Para  0   Term  0   Preterm  0   AB  0   Living  0      SAB  0   IAB  0   Ectopic  0   Multiple  0   Live Births               Home Medications    Prior to Admission medications   Medication Sig Start Date End Date Taking? Authorizing Provider  cefdinir (OMNICEF) 300 MG capsule Take 1 capsule (300 mg total) by mouth 2 (two) times daily for 7 days. 03/09/21 03/16/21 Yes Trevor Iha, FNP  etonogestrel-ethinyl estradiol (NUVARING) 0.12-0.015 MG/24HR vaginal ring Place in vagina on the first of every month and remove on the 25th day 09/24/19   Cresenzo-Dishmon, Scarlette Calico, CNM  naproxen (NAPROSYN) 500 MG tablet Take 1 tablet (500 mg total) by mouth 2 (two) times daily with a meal. 02/08/20   Daryll Drown, NP  Norethindrone-Ethinyl Estradiol-Fe Biphas (LO LOESTRIN FE) 1 MG-10 MCG / 10 MCG tablet Take 1 tablet by mouth daily. Patient not taking: Reported on 02/08/2020    [provider]  predniSONE (STERAPRED UNI-PAK 21 TAB) 10 MG (21) TBPK tablet 6 tablets by mouth day 1, 5 tablets day 2, 4 tablets daily 3, 3 tablet day for, 2 tablet day 5, 1 tablet day 6 02/10/20   Daryll Drown, NP    Family History Family History  Problem Relation Age of  Onset   Scoliosis Mother    Other Maternal Grandmother        suicide   Hypertension Maternal Grandfather    Diabetes Maternal Grandfather    Cancer Maternal Grandfather    Scoliosis Other     Social History Social History   Tobacco Use   Smoking status: Never   Smokeless tobacco: Never  Vaping Use   Vaping Use: Never used  Substance Use Topics   Alcohol use: No   Drug use: No     Allergies   Patient has no known allergies.   Review of Systems Review of Systems  HENT:  Positive for congestion, postnasal drip and sore throat.   Respiratory:  Negative for cough.   All other systems reviewed and are negative.   Physical Exam Triage Vital Signs ED Triage Vitals [03/09/21 1539]  Enc Vitals Group     BP 117/79     Pulse Rate 91     Resp 16     Temp 97.9 F (36.6 C)     Temp Source Oral     SpO2 100 %     Weight      Height  Head Circumference      Peak Flow      Pain Score 2     Pain Loc      Pain Edu?      Excl. in GC?    No data found.  Updated Vital Signs BP 117/79 (BP Location: Left Arm)   Pulse 91   Temp 97.9 F (36.6 C) (Oral)   Resp 16   SpO2 100%    Physical Exam Vitals and nursing note reviewed.  Constitutional:      General: She is not in acute distress.    Appearance: Normal appearance. She is normal weight. She is not ill-appearing.  HENT:     Head: Normocephalic and atraumatic.     Right Ear: Tympanic membrane, ear canal and external ear normal.     Left Ear: Tympanic membrane, ear canal and external ear normal.     Mouth/Throat:     Lips: Pink.     Mouth: Mucous membranes are moist.     Pharynx: Oropharynx is clear. Uvula midline. Posterior oropharyngeal erythema and uvula swelling present.     Tonsils: 3+ on the right. 3+ on the left.     Comments: Moderate amount of clear drainage of posterior oropharynx noted Cardiovascular:     Rate and Rhythm: Normal rate and regular rhythm.     Pulses: Normal pulses.     Heart  sounds: Normal heart sounds. No murmur heard. Pulmonary:     Effort: Pulmonary effort is normal.     Breath sounds: Normal breath sounds.     Comments: No adventitious breath sounds noted Musculoskeletal:        General: Normal range of motion.     Cervical back: Normal range of motion and neck supple. No tenderness.  Lymphadenopathy:     Cervical: Cervical adenopathy present.  Skin:    General: Skin is warm and dry.  Neurological:     General: No focal deficit present.     Mental Status: She is alert and oriented to person, place, and time. Mental status is at baseline.  Psychiatric:        Mood and Affect: Mood normal.        Behavior: Behavior normal.        Thought Content: Thought content normal.     UC Treatments / Results  Labs (all labs ordered are listed, but only abnormal results are displayed) Labs Reviewed  CULTURE, GROUP A STREP  POCT RAPID STREP A (OFFICE)    EKG   Radiology No results found.  Procedures Procedures (including critical care time)  Medications Ordered in UC Medications - No data to display  Initial Impression / Assessment and Plan / UC Course  I have reviewed the triage vital signs and the nursing notes.  Pertinent labs & imaging results that were available during my care of the patient were reviewed by me and considered in my medical decision making (see chart for details).     MDM: 1. Acute pharyngitis, unspecified etiology-rapid strep was negative, throat culture ordered; 2.  Tonsillitis-Rx'd Cefdinir; 3.  Allergic rhinitis-advised OTC Allegra 180 mg without D daily for the next 5 days for concurrent postnasal drainage/drip. Advised/instructed patient to take medication as directed with food to completion.  Advised patient to take OTC Allegra 180 mg without D daily for the next 5 days for concurrent postnasal drip/drainage.  Encouraged patient to increase daily water intake while taking these medications.  Work note provided prior to  discharge.  Discharged home, hemodynamically stable. Final Clinical Impressions(s) / UC Diagnoses   Final diagnoses:  Acute pharyngitis, unspecified etiology  Acute tonsillitis, unspecified etiology  Allergic rhinitis, unspecified seasonality, unspecified trigger     Discharge Instructions      Advised/instructed patient to take medication as directed with food to completion.  Advised patient to take OTC Allegra 180 mg without D daily for the next 5 days for concurrent postnasal drip/drainage.  Encouraged patient to increase daily water intake while taking these medications.  Work note provided prior to discharge.     ED Prescriptions     Medication Sig Dispense Auth. Provider   cefdinir (OMNICEF) 300 MG capsule Take 1 capsule (300 mg total) by mouth 2 (two) times daily for 7 days. 14 capsule Trevor Iha, FNP      PDMP not reviewed this encounter.   Trevor Iha, FNP 03/09/21 1636

## 2021-03-09 NOTE — ED Triage Notes (Signed)
Pt present coughing with nasal congestion and drainage. Symptom started three days ago.

## 2021-03-14 LAB — CULTURE, GROUP A STREP: Strep A Culture: NEGATIVE

## 2021-08-31 ENCOUNTER — Other Ambulatory Visit: Payer: Self-pay | Admitting: Family Medicine

## 2021-08-31 DIAGNOSIS — N926 Irregular menstruation, unspecified: Secondary | ICD-10-CM

## 2021-08-31 DIAGNOSIS — R102 Pelvic and perineal pain: Secondary | ICD-10-CM

## 2021-10-15 ENCOUNTER — Other Ambulatory Visit: Payer: Self-pay

## 2021-10-15 ENCOUNTER — Encounter (HOSPITAL_COMMUNITY): Payer: Self-pay

## 2021-10-15 ENCOUNTER — Emergency Department (HOSPITAL_COMMUNITY)
Admission: EM | Admit: 2021-10-15 | Discharge: 2021-10-15 | Disposition: A | Discharge status: Home / Self Care | Payer: 59 | Attending: Emergency Medicine | Admitting: Emergency Medicine

## 2021-10-15 DIAGNOSIS — H6691 Otitis media, unspecified, right ear: Secondary | ICD-10-CM | POA: Diagnosis not present

## 2021-10-15 DIAGNOSIS — Z20822 Contact with and (suspected) exposure to covid-19: Secondary | ICD-10-CM | POA: Insufficient documentation

## 2021-10-15 DIAGNOSIS — J069 Acute upper respiratory infection, unspecified: Secondary | ICD-10-CM | POA: Diagnosis not present

## 2021-10-15 DIAGNOSIS — J029 Acute pharyngitis, unspecified: Secondary | ICD-10-CM | POA: Diagnosis present

## 2021-10-15 LAB — RESP PANEL BY RT-PCR (FLU A&B, COVID) ARPGX2
Influenza A by PCR: NEGATIVE
Influenza B by PCR: NEGATIVE
SARS Coronavirus 2 by RT PCR: NEGATIVE

## 2021-10-15 LAB — GROUP A STREP BY PCR: Group A Strep by PCR: NOT DETECTED

## 2021-10-15 MED ORDER — ONDANSETRON 4 MG PO TBDP
4.0000 mg | ORAL_TABLET | Freq: Once | ORAL | Status: AC
  Administered 2021-10-15: 4 mg via ORAL
  Filled 2021-10-15: qty 1

## 2021-10-15 MED ORDER — IBUPROFEN 200 MG PO TABS
600.0000 mg | ORAL_TABLET | Freq: Once | ORAL | Status: AC
  Administered 2021-10-15: 600 mg via ORAL
  Filled 2021-10-15: qty 3

## 2021-10-15 MED ORDER — AMOXICILLIN-POT CLAVULANATE 875-125 MG PO TABS: 1.0000 | ORAL_TABLET | Freq: Two times a day (BID) | ORAL | 0 refills | Status: AC

## 2021-10-15 NOTE — Discharge Instructions (Addendum)
It was a pleasure taking care of you!  ? ?Your COVID, flu, strep swabs were negative today.  You may take over the counter 600 mg Ibuprofen every 6 hours as needed for pain for no more than 7 days. You will be sent an antibiotic for your ear, completed the entire prescription. You may take continue taking over-the-counter cough and cold medications as needed for your symptoms. Ensure to maintain fluid intake with tea, soup, broth, Pedialyte, Gatorade, water.  You may follow-up with your primary care provider as needed.  Return to the Emergency Department if you are experiencing trouble breathing, worsening or increasing chest pain, decreased fluid intake or worsening symptoms. ?

## 2021-10-15 NOTE — ED Provider Notes (Signed)
?[facility] ?Provider Note ? ? ?CSN: [id number] ?Arrival date & time: 10/15/21  [id number] ? ?  ? ?History ? ?Chief Complaint  ?Patient presents with  ? Sore Throat  ? Headache  ? Chills  ? ? ?Pamela Richardson is a 21 y.o. female who presents to the emergency department complaining of sore throat onset 3 days.  Denies sick contacts.  Has associated headache, chills, decreased hearing to right ear, cough, nasal congestion, and nausea. Has tried over-the-counter DayQuil and NyQuil for her symptoms. Her last dose of medication was at 6 PM last night. Denies ear pain, ear drainage, trouble swallowing, fever, or rhinorrhea.  ? ?The history is provided by the patient. No language interpreter was used.  ? ?  ? ?Home Medications ?Prior to Admission medications   ?Medication Sig Start Date End Date Taking? Authorizing Provider  ?amoxicillin-clavulanate (AUGMENTIN) 875-125 MG tablet Take 1 tablet by mouth every 12 (twelve) hours for 5 days. 10/15/21 10/20/21 Yes Nahia Nissan A, PA-C  ?etonogestrel-ethinyl estradiol (NUVARING) 0.12-0.015 MG/24HR vaginal ring Place in vagina on the first of every month and remove on the 25th day 09/24/19   Christin Fudge, CNM  ?naproxen (NAPROSYN) 500 MG tablet Take 1 tablet (500 mg total) by mouth 2 (two) times daily with a meal. 02/08/20   Ivy Lynn, NP  ?Norethindrone-Ethinyl Estradiol-Fe Biphas (LO LOESTRIN FE) 1 MG-10 MCG / 10 MCG tablet Take 1 tablet by mouth daily. ?Patient not taking: Reported on 02/08/2020    [provider]  ?predniSONE (STERAPRED UNI-PAK 21 TAB) 10 MG (21) TBPK tablet 6 tablets by mouth day 1, 5 tablets day 2, 4 tablets daily 3, 3 tablet day for, 2 tablet day 5, 1 tablet day 6 02/10/20   Ivy Lynn, NP  ?   ? ?Allergies    ?Patient has no known allergies.   ? ?Review of Systems   ?Review of Systems  ?Constitutional:  Positive for chills. Negative for fever.  ?HENT:  Positive for congestion and sore throat. Negative  for ear discharge, ear pain, rhinorrhea and trouble swallowing.   ?Gastrointestinal:  Positive for nausea. Negative for vomiting.  ?All other systems reviewed and are negative. ? ?Physical Exam ?Updated Vital Signs ?BP 122/84 (BP Location: Left Arm)   Pulse 90   Temp 98.2 ?F (36.8 ?C) (Oral)   Resp 18   Ht 5\' 5"  (1.651 m)   Wt 58.1 kg   SpO2 100%   BMI 21.30 kg/m?  ?Physical Exam ?Vitals and nursing note reviewed.  ?Constitutional:   ?   General: She is not in acute distress. ?   Appearance: She is not diaphoretic.  ?HENT:  ?   Head: Normocephalic and atraumatic.  ?   Right Ear: No mastoid tenderness. No hemotympanum. Tympanic membrane is erythematous.  ?   Left Ear: Tympanic membrane, ear canal and external ear normal. No mastoid tenderness. No hemotympanum.  ?   Mouth/Throat:  ?   Mouth: Mucous membranes are moist.  ?   Pharynx: Oropharynx is clear. Uvula midline. No oropharyngeal exudate or uvula swelling.  ?   Tonsils: No tonsillar exudate.  ?   Comments: Uvula midline without swelling. Posterior pharyngeal erythema noted. No appreciable tonsillar exudate. ?Eyes:  ?   General: No scleral icterus. ?   Conjunctiva/sclera: Conjunctivae normal.  ?Cardiovascular:  ?   Rate and Rhythm: Normal rate and regular rhythm.  ?   Pulses: Normal pulses.  ?   Heart sounds: Normal heart  sounds.  ?Pulmonary:  ?   Effort: Pulmonary effort is normal. No respiratory distress.  ?   Breath sounds: Normal breath sounds. No wheezing.  ?Abdominal:  ?   General: Bowel sounds are normal.  ?   Palpations: Abdomen is soft. There is no mass.  ?   Tenderness: There is no abdominal tenderness. There is no guarding or rebound.  ?Musculoskeletal:     ?   General: Normal range of motion.  ?   Cervical back: Normal range of motion and neck supple.  ?Skin: ?   General: Skin is warm and dry.  ?Neurological:  ?   Mental Status: She is alert.  ?Psychiatric:     ?   Behavior: Behavior normal.  ? ? ?ED Results / Procedures / Treatments    ?Labs ?(all labs ordered are listed, but only abnormal results are displayed) ?Labs Reviewed  ?RESP PANEL BY RT-PCR (FLU A&B, COVID) [id number]  ?GROUP A STREP BY PCR  ? ? ?EKG ?None ? ?Radiology ?No results found. ? ?Procedures ?Procedures  ? ? ?Medications Ordered in ED ?Medications  ?ibuprofen (ADVIL) tablet 600 mg (600 mg Oral Given 10/15/21 0719)  ?ondansetron (ZOFRAN-ODT) disintegrating tablet 4 mg (4 mg Oral Given 10/15/21 0720)  ? ? ?ED Course/ Medical Decision Making/ A&P ?Clinical Course as of 10/15/21 0855  ?Thu Oct 15, 2021  ?0820 Re-evaluated and discussed discharge treatment plan. Pt noted continued headache, discussed with patient that we can give her IVF for her headache, however, there is no guarantee that her headache will completely abate. Pt noted that she would not like to proceed with IVF at this time. Pt appears safe for discharge at this time. [SB]  ?  ?Clinical Course User Index ?[SB] Damien Batty A, PA-C  ? ?                        ?Medical Decision Making ?Risk ?OTC drugs. ?Prescription drug management. ? ? ?Patient with sore throat, headache, chills onset 3 days. Denies sick contacts. Vitals stable, pt afebrile. On exam patient with erythema noted to right TM, otherwise cardiovascular, pulmonary, abdominal exam without acute findings.  Differential diagnosis includes COVID, flu, strep, otitis media, viral URI with cough. ? ?Labs:  ?I ordered, and personally interpreted labs.  The pertinent results include:   ?COVID and flu swab negative. ?Strep swab negative ? ?Medications:  ?I ordered medication including ibuprofen and Zofran for symptom management ?Reevaluation of the patient after these medicines and interventions, I reevaluated the patient and found that they have improved ?I have reviewed the patients home medicines and have made adjustments as needed ? ? ?Disposition: ?Presentation suspicious for viral URI with cough as well as right otitis media.  Doubt COVID, flu, strep. After  consideration of the diagnostic results and the patients response to treatment, I feel that the patient would benefit from Discharge home. Supportive care measures and strict return precautions discussed with patient at bedside. Pt acknowledges and verbalizes understanding. Pt appears safe for discharge. Follow up as indicated in discharge paperwork.  ? ? ?This chart was dictated using voice recognition software, Dragon. Despite the best efforts of this provider to proofread and correct errors, errors may still occur which can change documentation meaning. ? ?Final Clinical Impression(s) / ED Diagnoses ?Final diagnoses:  ?Viral URI with cough  ?Right otitis media, unspecified otitis media type  ? ? ?Rx / DC Orders ?ED Discharge Orders   ? ?  Ordered  ?  amoxicillin-clavulanate (AUGMENTIN) 875-125 MG tablet  Every 12 hours       ? 10/15/21 0823  ? ?  ?  ? ?  ? ? ?  ?Larisa Lanius A, PA-C ?10/15/21 [id number] ? ?  ?Lennice Sites, DO ?10/15/21 [id number] ? ?

## 2021-10-15 NOTE — ED Triage Notes (Signed)
Pt reports with sore throat, headache, and chills x 3 days. Pt also states that she cannot hear out of her right ear.  ?

## 2021-10-15 NOTE — ED Notes (Signed)
I provided reinforced discharge education based off of discharge instructions. Pt acknowledged and understood my education. Pt had no further questions/concerns for provider/myself.  °

## 2021-10-16 ENCOUNTER — Telehealth: Payer: Self-pay

## 2021-10-16 NOTE — Telephone Encounter (Signed)
Transition Care Management Unsuccessful Follow-up Telephone Call ? ?Date of discharge and from where:  10/15/2021 from  [facility] ? ?Attempts:  1st Attempt ? ?Reason for unsuccessful TCM follow-up call:  Left voice message ? ? ? ?

## 2021-10-20 NOTE — Telephone Encounter (Signed)
Transition Care Management Unsuccessful Follow-up Telephone Call ? ?Date of discharge and from where:  10/15/2021 from  [facility] ? ?Attempts:  2nd Attempt ? ?Reason for unsuccessful TCM follow-up call:  Left voice message ? ? ? ?

## 2021-10-21 NOTE — Telephone Encounter (Signed)
Transition Care Management Unsuccessful Follow-up Telephone Call ? ?Date of discharge and from where:  10/15/2021 from  [facility] ? ?Attempts:  3rd Attempt ? ?Reason for unsuccessful TCM follow-up call:  Unable to reach patient ? ? ? ?
